# Patient Record
Sex: Female | Born: 1995 | Race: White | Hispanic: No | Marital: Single | Smoking: Never smoker
Health system: Southern US, Community
[De-identification: ages and names within clinical notes are randomized; demographics above are authoritative.]

## PROBLEM LIST (undated history)

## (undated) DIAGNOSIS — R569 Unspecified convulsions: Secondary | ICD-10-CM

## (undated) DIAGNOSIS — F5082 Avoidant/restrictive food intake disorder: Secondary | ICD-10-CM

## (undated) DIAGNOSIS — Z9889 Other specified postprocedural states: Secondary | ICD-10-CM

## (undated) HISTORY — DX: Other specified postprocedural states: Z98.890

## (undated) HISTORY — DX: Avoidant/restrictive food intake disorder: F50.82

## (undated) HISTORY — PX: TYMPANOSTOMY TUBE PLACEMENT: SHX32

---
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
## 2015-09-08 ENCOUNTER — Emergency Department (HOSPITAL_COMMUNITY)
Admission: EM | Admit: 2015-09-08 | Discharge: 2015-09-08 | Disposition: A | Discharge status: Home / Self Care | Payer: Medicaid Other | Attending: Emergency Medicine | Admitting: Emergency Medicine

## 2015-09-08 ENCOUNTER — Emergency Department (HOSPITAL_COMMUNITY): Payer: Medicaid Other

## 2015-09-08 ENCOUNTER — Encounter (HOSPITAL_COMMUNITY): Payer: Self-pay

## 2015-09-08 DIAGNOSIS — Z79899 Other long term (current) drug therapy: Secondary | ICD-10-CM | POA: Insufficient documentation

## 2015-09-08 DIAGNOSIS — Y92000 Kitchen of unspecified non-institutional (private) residence as  the place of occurrence of the external cause: Secondary | ICD-10-CM | POA: Diagnosis not present

## 2015-09-08 DIAGNOSIS — G8929 Other chronic pain: Secondary | ICD-10-CM | POA: Diagnosis not present

## 2015-09-08 DIAGNOSIS — S01511A Laceration without foreign body of lip, initial encounter: Secondary | ICD-10-CM | POA: Insufficient documentation

## 2015-09-08 DIAGNOSIS — Y9389 Activity, other specified: Secondary | ICD-10-CM | POA: Diagnosis not present

## 2015-09-08 DIAGNOSIS — R569 Unspecified convulsions: Secondary | ICD-10-CM

## 2015-09-08 DIAGNOSIS — R1013 Epigastric pain: Secondary | ICD-10-CM | POA: Insufficient documentation

## 2015-09-08 DIAGNOSIS — W1839XA Other fall on same level, initial encounter: Secondary | ICD-10-CM | POA: Insufficient documentation

## 2015-09-08 DIAGNOSIS — Y998 Other external cause status: Secondary | ICD-10-CM | POA: Insufficient documentation

## 2015-09-08 DIAGNOSIS — R112 Nausea with vomiting, unspecified: Secondary | ICD-10-CM | POA: Insufficient documentation

## 2015-09-08 LAB — CBC WITH DIFFERENTIAL/PLATELET
Basophils Absolute: 0 10*3/uL (ref 0.0–0.1)
Basophils Relative: 0 %
EOS ABS: 0.1 10*3/uL (ref 0.0–0.7)
Eosinophils Relative: 2 %
HEMATOCRIT: 40.4 % (ref 36.0–46.0)
HEMOGLOBIN: 13.4 g/dL (ref 12.0–15.0)
LYMPHS ABS: 1.3 10*3/uL (ref 0.7–4.0)
Lymphocytes Relative: 28 %
MCH: 30.2 pg (ref 26.0–34.0)
MCHC: 33.2 g/dL (ref 30.0–36.0)
MCV: 91 fL (ref 78.0–100.0)
Monocytes Absolute: 0.3 10*3/uL (ref 0.1–1.0)
Monocytes Relative: 7 %
NEUTROS ABS: 2.9 10*3/uL (ref 1.7–7.7)
NEUTROS PCT: 63 %
Platelets: 247 10*3/uL (ref 150–400)
RBC: 4.44 MIL/uL (ref 3.87–5.11)
RDW: 14.2 % (ref 11.5–15.5)
WBC: 4.6 10*3/uL (ref 4.0–10.5)

## 2015-09-08 LAB — URINE MICROSCOPIC-ADD ON

## 2015-09-08 LAB — RAPID URINE DRUG SCREEN, HOSP PERFORMED
AMPHETAMINES: NOT DETECTED
BARBITURATES: NOT DETECTED
Benzodiazepines: NOT DETECTED
COCAINE: NOT DETECTED
Opiates: NOT DETECTED
TETRAHYDROCANNABINOL: NOT DETECTED

## 2015-09-08 LAB — COMPREHENSIVE METABOLIC PANEL
ALT: 15 U/L (ref 14–54)
ANION GAP: 11 (ref 5–15)
AST: 19 U/L (ref 15–41)
Albumin: 3.8 g/dL (ref 3.5–5.0)
Alkaline Phosphatase: 42 U/L (ref 38–126)
BUN: 8 mg/dL (ref 6–20)
CO2: 23 mmol/L (ref 22–32)
CREATININE: 0.81 mg/dL (ref 0.44–1.00)
Calcium: 9 mg/dL (ref 8.9–10.3)
Chloride: 106 mmol/L (ref 101–111)
Glucose, Bld: 101 mg/dL — ABNORMAL HIGH (ref 65–99)
Potassium: 4.1 mmol/L (ref 3.5–5.1)
Sodium: 140 mmol/L (ref 135–145)
Total Bilirubin: 0.2 mg/dL — ABNORMAL LOW (ref 0.3–1.2)
Total Protein: 6.9 g/dL (ref 6.5–8.1)

## 2015-09-08 LAB — URINALYSIS, ROUTINE W REFLEX MICROSCOPIC
Bilirubin Urine: NEGATIVE
GLUCOSE, UA: NEGATIVE mg/dL
Ketones, ur: NEGATIVE mg/dL
Leukocytes, UA: NEGATIVE
Nitrite: NEGATIVE
PH: 7.5 (ref 5.0–8.0)
Protein, ur: NEGATIVE mg/dL
SPECIFIC GRAVITY, URINE: 1.01 (ref 1.005–1.030)

## 2015-09-08 LAB — LIPASE, BLOOD: LIPASE: 85 U/L — AB (ref 11–51)

## 2015-09-08 LAB — POC URINE PREG, ED: Preg Test, Ur: NEGATIVE

## 2015-09-08 LAB — ETHANOL: Alcohol, Ethyl (B): <5 mg/dL (ref <5)

## 2015-09-08 MED ORDER — ONDANSETRON HCL 4 MG/2ML IJ SOLN
4.0000 mg | Freq: Once | INTRAMUSCULAR | Status: AC
  Administered 2015-09-08: 4 mg via INTRAVENOUS
  Filled 2015-09-08: qty 2

## 2015-09-08 MED ORDER — LEVETIRACETAM 100 MG/ML PO SOLN
500.0000 mg | ORAL | Status: AC
  Administered 2015-09-08: 500 mg via ORAL
  Filled 2015-09-08: qty 5

## 2015-09-08 MED ORDER — SODIUM CHLORIDE 0.9 % IV BOLUS (SEPSIS)
1000.0000 mL | Freq: Once | INTRAVENOUS | Status: AC
  Administered 2015-09-08: 1000 mL via INTRAVENOUS

## 2015-09-08 MED ORDER — LEVETIRACETAM 500 MG PO TABS
500.0000 mg | ORAL_TABLET | Freq: Once | ORAL | Status: DC
  Filled 2015-09-08 (×2): qty 1

## 2015-09-08 MED ORDER — LEVETIRACETAM 100 MG/ML PO SOLN: 500.0000 mg | Freq: Two times a day (BID) | ORAL | Status: DC

## 2015-09-08 MED ORDER — ONDANSETRON 4 MG PO TBDP: 4.0000 mg | ORAL_TABLET | Freq: Three times a day (TID) | ORAL | Status: DC | PRN

## 2015-09-08 NOTE — Discharge Instructions (Signed)
You have been seen today for a seizure. Your imaging showed no abnormalities. Follow up with neurology in the office as soon as possible for reevaluation and chronic management. Call the office today to make an appointment for sometime in the future. Follow up with PCP as needed. Return to ED should symptoms worsen. You've been started on Keppra 500 mg twice a day, beginning immediately. You should take this medication with food to avoid upset stomach. You must take this medication as directed and consistently for it to have the desired effect. On a side note, your lipase was 85, which is higher than the normal range, but is not an emergent condition, such as acute pancreatitis. This value will just have to be followed up on by your PCP.

## 2015-09-08 NOTE — ED Notes (Signed)
Pt.s mother has concerned about strength of medication.  Reported to Harolyn Rutherford, [place] ,  He is speaking with the family

## 2015-09-08 NOTE — ED Notes (Signed)
Called CT scan pt. Is ready

## 2015-09-08 NOTE — ED Notes (Signed)
Pt. And family is aware of needing an urine specimen.  

## 2015-09-08 NOTE — ED Provider Notes (Signed)
CSN: [id number]     Arrival date & time 09/08/15  4098 History   First MD Initiated Contact with Patient 09/08/15 [phone]     Chief Complaint  Patient presents with  . Seizures     (Consider location/radiation/quality/duration/timing/severity/associated sxs/prior Treatment) HPI   Martha Wells is a 20 y.o. female, with a history of developmental delay and seizures, presenting to the ED with a grand mal seizure that occurred just PTA. Witnessed by patient's father, now at the bedside. Father states that the patient was sitting on a chair in the kitchen, fell to the floor, and began having full body convulsions. Positive for urinary incontinence and vomiting. Patient also complains of abdominal pain, which she indicates is chronic. Patient states that she hurts "a little bit." Pain is nonradiating and is accompanied by nausea. Pt has a history of seizures, but has not had one for over a year, and is not on medication for them. Pt and patient's parents confirm that the patient has never been on seizure medication before. 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 Put on OCPs and increased diet, period returned. EMS reports pt was CAOx4 upon their arrival with no seizure activity noted. Patient's parents at the bedside confirm pt is behaving normally. Pt is currently menstruating. Patient's parents also state that the patient has had intermittent "episodes" that include the patient's face unilaterally becoming numb, followed by a period of 30 seconds to 1 minute where the patient stares straight ahead and is not arousable.   Past Medical History  Diagnosis Date  . Seizures (HCC)     7 weeks old   Past Surgical History  Procedure Laterality Date  . Foot surgery     No family history on file. Social History  Substance Use Topics  . Smoking status: Never  Smoker   . Smokeless tobacco: None  . Alcohol Use: No   OB History    No data available     Review of Systems  Constitutional: Negative for fever, chills and diaphoresis.  Respiratory: Negative for shortness of breath.   Cardiovascular: Negative for chest pain.  Gastrointestinal: Positive for nausea, vomiting and abdominal pain. Negative for diarrhea, constipation and blood in stool.  Genitourinary: Negative for dysuria and hematuria.  Musculoskeletal: Negative for back pain and neck pain.  Skin: Negative for color change and pallor.  Neurological: Positive for seizures. Negative for dizziness, weakness, light-headedness and headaches.  All other systems reviewed and are negative.     Allergies  Review of patient's allergies indicates no known allergies.  Home Medications   Prior to Admission medications   Medication Sig Start Date End Date Taking? Authorizing Provider  Norgestim-Eth Estrad Triphasic (TRI-SPRINTEC PO) Take 1 tablet by mouth daily with supper.   Yes Historical Provider, MD  Aspirin-Acetaminophen-Caffeine (EXCEDRIN PO) Take 2 tablets by mouth daily as needed (pain). Reported on 09/08/2015    Historical Provider, MD  B Complex Vitamins (VITAMIN B COMPLEX) TABS Take 1 tablet by mouth daily. Reported on 09/08/2015    Historical Provider, MD  Calcium Carbonate-Vitamin D (CALCIUM CREAMIES PO) Take 1 tablet by mouth daily. Reported on 09/08/2015    Historical Provider, MD  Cholecalciferol (EQL VITAMIN D GUMMIES CHILD) 400 UNITS CHEW Chew 400 Units by mouth daily. Reported on 09/08/2015    Historical Provider, MD  levETIRAcetam (KEPPRA)  100 MG/ML solution Take 5 mLs (500 mg total) by mouth 2 (two) times daily. 09/08/15   Barrett Goldie C Keshaun Dubey, PA-C  Multiple Vitamin (MULTIVITAMIN WITH MINERALS) TABS tablet Take 1 tablet by mouth daily. Reported on 09/08/2015    Historical Provider, MD  Multiple Vitamins-Minerals (HAIR/SKIN/NAILS/BIOTIN) TABS Take 1 tablet by mouth daily. Reported on 09/08/2015     Historical Provider, MD  ondansetron (ZOFRAN ODT) 4 MG disintegrating tablet Take 1 tablet (4 mg total) by mouth every 8 (eight) hours as needed for nausea or vomiting. 09/08/15   Velva Molinari C Milayna Rotenberg, PA-C  vitamin E 400 UNIT capsule Take 400 Units by mouth daily. Reported on 09/08/2015    Historical Provider, MD   BP 86/69 mmHg  Pulse 90  Temp(Src) 98.9 F (37.2 C) (Oral)  Resp 22  SpO2 100%  LMP 09/08/2015 Physical Exam  Constitutional: She is oriented to person, place, and time. She appears well-developed and well-nourished. No distress.  HENT:  Head: Normocephalic and atraumatic.  Mouth/Throat: Oropharynx is clear and moist.  0.25 cm superficial laceration noted to inside upper lip at the midline. Associated swelling noted. Dentition intact and accounted for.  Eyes: Conjunctivae and EOM are normal. Pupils are equal, round, and reactive to light.  Pupils contrict readily to light, but then expand and contract slightly once contricted.   Neck: Normal range of motion. Neck supple.  Cardiovascular: Normal rate, regular rhythm, normal heart sounds and intact distal pulses.   Pulmonary/Chest: Effort normal and breath sounds normal. No respiratory distress.  Abdominal: Soft. Bowel sounds are normal. There is tenderness in the epigastric area. There is no guarding.  Musculoskeletal: She exhibits no edema or tenderness.  Full ROM in all extremities and spine. No paraspinal tenderness.   Lymphadenopathy:    She has no cervical adenopathy.  Neurological: She is alert and oriented to person, place, and time. She has normal reflexes.  No sensory deficits. Strength 5/5 in all extremities. No gait disturbance. Coordination intact. Cranial nerves III-XII grossly intact. No facial droop.   Skin: Skin is warm and dry. She is not diaphoretic.  Psychiatric: She has a normal mood and affect. Her behavior is normal.  Nursing note and vitals reviewed.   ED Course  Procedures (including critical care time) Labs  Review Labs Reviewed  COMPREHENSIVE METABOLIC PANEL - Abnormal; Notable for the following:    Glucose, Bld 101 (*)    Total Bilirubin 0.2 (*)    All other components within normal limits  URINALYSIS, ROUTINE W REFLEX MICROSCOPIC (NOT AT [facility]) - Abnormal; Notable for the following:    APPearance TURBID (*)    Hgb urine dipstick LARGE (*)    All other components within normal limits  LIPASE, BLOOD - Abnormal; Notable for the following:    Lipase 85 (*)    All other components within normal limits  URINE MICROSCOPIC-ADD ON - Abnormal; Notable for the following:    Squamous Epithelial / LPF 0-5 (*)    Bacteria, UA RARE (*)    All other components within normal limits  URINE CULTURE  CBC WITH DIFFERENTIAL/PLATELET  URINE RAPID DRUG SCREEN, HOSP PERFORMED  ETHANOL  POC URINE PREG, ED  CBG MONITORING, ED    Imaging Review Ct Head Wo Contrast  09/08/2015  CLINICAL DATA:  Patient found down this morning in grand mal seizure. Upper lip swelling and swelling about the right eye. Initial encounter. EXAM: CT HEAD WITHOUT CONTRAST CT MAXILLOFACIAL WITHOUT CONTRAST CT CERVICAL SPINE WITHOUT CONTRAST TECHNIQUE: Multidetector CT  imaging of the head, cervical spine, and maxillofacial structures were performed using the standard protocol without intravenous contrast. Multiplanar CT image reconstructions of the cervical spine and maxillofacial structures were also generated. COMPARISON:  Brain MRI 09/24/2013. FINDINGS: CT HEAD FINDINGS Remote bilateral parietal lobe infarcts, worse on the left, are seen. There are also remote bilateral occipital lobe infarcts, worse on the right. No evidence of acute intracranial abnormality including hemorrhage, infarct, mass lesion, mass effect, midline shift or abnormal extra-axial fluid collection is identified. There is no hydrocephalus or pneumocephalus. Imaged paranasal sinuses and mastoid air cells are clear. The calvarium is intact. CT MAXILLOFACIAL FINDINGS No facial  bone fracture is identified. The globes are intact and the lenses are located. The paranasal sinuses and mastoid air cells are clear. CT CERVICAL SPINE FINDINGS Vertebral body height and alignment are maintained in the cervical spine. 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 Lung apices are clear. IMPRESSION: No acute abnormality head, face or cervical spine. Remote bilateral parietal and occipital lobe infarcts. Electronically Signed   By: Drusilla Kanner M.D.   On: 09/08/2015 10:14   Ct Cervical Spine Wo Contrast  09/08/2015  CLINICAL DATA:  Patient found down this morning in grand mal seizure. Upper lip swelling and swelling about the right eye. Initial encounter. EXAM: CT HEAD WITHOUT CONTRAST CT MAXILLOFACIAL WITHOUT CONTRAST CT CERVICAL SPINE WITHOUT CONTRAST TECHNIQUE: Multidetector CT imaging of the head, cervical spine, and maxillofacial structures were performed using the standard protocol without intravenous contrast. Multiplanar CT image reconstructions of the cervical spine and maxillofacial structures were also generated. COMPARISON:  Brain MRI 09/24/2013. FINDINGS: CT HEAD FINDINGS Remote bilateral parietal lobe infarcts, worse on the left, are seen. There are also remote bilateral occipital lobe infarcts, worse on the right. No evidence of acute intracranial abnormality including hemorrhage, infarct, mass lesion, mass effect, midline shift or abnormal extra-axial fluid collection is identified. There is no hydrocephalus or pneumocephalus. Imaged paranasal sinuses and mastoid air cells are clear. The calvarium is intact. CT MAXILLOFACIAL FINDINGS No facial bone fracture is identified. The globes are intact and the lenses are located. The paranasal sinuses and mastoid air cells are clear. CT CERVICAL SPINE FINDINGS Vertebral body height and alignment are maintained in the cervical spine. 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 Lung apices are clear. IMPRESSION: No acute abnormality head, face or cervical spine. Remote bilateral parietal and occipital lobe infarcts. Electronically Signed   By: Drusilla Kanner M.D.   On: 09/08/2015 10:14   Ct Maxillofacial Wo Cm  09/08/2015  CLINICAL DATA:  Patient found down this morning in grand mal seizure. Upper lip swelling and swelling about the right eye. Initial encounter. EXAM: CT HEAD WITHOUT CONTRAST CT MAXILLOFACIAL WITHOUT CONTRAST CT CERVICAL SPINE WITHOUT CONTRAST TECHNIQUE: Multidetector CT imaging of the head, cervical spine, and maxillofacial structures were performed using the standard protocol without intravenous contrast. Multiplanar CT image reconstructions of the cervical spine and maxillofacial structures were also generated. COMPARISON:  Brain MRI 09/24/2013. FINDINGS: CT HEAD FINDINGS Remote bilateral parietal lobe infarcts, worse on the left, are seen. There are also remote bilateral occipital lobe infarcts, worse on the right. No evidence of acute intracranial abnormality including hemorrhage, infarct, mass lesion, mass effect, midline shift or abnormal extra-axial fluid collection is identified. There is no hydrocephalus or pneumocephalus. Imaged paranasal sinuses and mastoid air cells are clear. The calvarium is intact. CT MAXILLOFACIAL FINDINGS No  facial bone fracture is identified. The globes are intact and the lenses are located. The paranasal sinuses and mastoid air cells are clear. CT CERVICAL SPINE FINDINGS Vertebral body height and alignment are maintained in the cervical spine. 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 Lung apices are clear. IMPRESSION: No acute abnormality head, face or cervical spine. Remote bilateral parietal and occipital lobe infarcts. Electronically Signed   By: Drusilla Kanner M.D.   On: 09/08/2015 10:14   I have personally reviewed and evaluated these images and lab results as part of my  medical decision-making.   EKG Interpretation   Date/Time:  Friday September 08 2015 08:47:13 EST Ventricular Rate:  88 PR Interval:  156 QRS Duration: 81 QT Interval:  368 QTC Calculation: 445 R Axis:   90 Text Interpretation:  Sinus rhythm No significant change since last  tracing Confirmed by Denton Lank  MD, Caryn Bee ([id number]) on 09/08/2015 8:52:39 AM      MDM   Final diagnoses:  Seizure (HCC)    Martha Wells presents following a seizure and fall that occurred just prior to arrival.  Findings and plan of care discussed with Cathren Laine, MD.  Patient receive labs and imaging a consistent with a full seizure assessment. Neurology consult. Patient is afebrile, nontoxic-appearing, not tachycardic, not tachypneic upon my exam, is alert and oriented, maintains SPO2 of 99-100% on room air, is normotensive, and is in no apparent distress. Patient has not had repeat seizures since the original one today.  9:42 AM Spoke with Dr. Amada Jupiter, neurologist, who advised that the patient should be started on Keppra  BID and have her follow-up in the office outpatient. Patient and patient's parents were updated on this treatment plan. All parties agreed to the plan. Patient's parents had many follow-up questions about the choice of medication as well as the side effects. All questions and concerns were addressed. Patient and patient's parents were given informational printouts on Keppra and encouraged to bring any further questions or concerns either to myself or to the neurologist after discharge. Patient and her family were updated on all lab results and advised to follow-up on the lipase with her PCP. In regards to the patient's blood pressure, she readily ambulates without trouble, has no complaints of dizziness or lightheadedness, and confirms that these readings and the variation are typical for her.  Filed Vitals:   09/08/15 0852 09/08/15 0900 09/08/15 1000 09/08/15 1015  BP: 107/69  107/71 104/76 102/70  Pulse: 97 98 83 90  Temp: 98.1 F (36.7 C)     TempSrc: Oral     Resp: SpO2: 100% 99% 100% 99%   Filed Vitals:   09/08/15 1207 09/08/15 1230 09/08/15 1330 09/08/15 1351  BP: 98/67 98/71  86/69  Pulse: 87 72 90   Temp:      TempSrc:      Resp: SpO2: 100% 100% 100%       Anselm Pancoast, PA-C 09/08/15 1404  Cathren Laine, MD 09/08/15 1414

## 2015-09-08 NOTE — ED Notes (Signed)
Pt ambulated to restroom with assist

## 2015-09-08 NOTE — ED Notes (Signed)
[employer] arrived

## 2015-09-08 NOTE — ED Notes (Signed)
Pt. Is also aware of needing an urine specimen 

## 2015-09-08 NOTE — ED Notes (Signed)
Pt. Woke up this am to get ready for work and family member found her on the floor in a gran mal seizure.  She also had vomited at one point.  There was cottage cheese on the floor beside her.  Paramedics reports that she was alert and oriented X4 when they arrived.  Her rt. Upper lip is swollen and swelling noted under her rt. Eye.  She has a hx of seizures when she was an infant.  She has never been on medication for them.   Family at the bedside.    This seizure was unwitnessed. Pt. Denies any pain at this time

## 2015-09-08 NOTE — ED Notes (Addendum)
Pt. Eating a [place] sandwich, jello and apple juice. Encouraging pt. To eat , prior to giving her Keppra.

## 2015-09-08 NOTE — ED Notes (Signed)
Reported pt.s Blood pressure 86/69  Shawn, PA>  Pt. Is not having any dizziness or lightheaded .  She is walking in the room, waiting for her mom to bring clothes.  Pt. s fathers stated, "This is her normal"

## 2015-09-08 NOTE — ED Notes (Signed)
Martha Wells tried to do the CBg, she was unsuccessful due to not being able to get a full blood sample.

## 2015-09-08 NOTE — ED Notes (Signed)
Pt. Unable to swallow pill, spoke with Fleet Contras , pharmacist. She will be ordering Keppra in suspension form.

## 2015-09-08 NOTE — ED Notes (Signed)
No seizure noted since arrival

## 2015-09-08 NOTE — ED Notes (Signed)
Pt. Has not ate any of the food,. Gave pt. A menu to order something from there.

## 2015-09-10 LAB — URINE CULTURE

## 2015-09-12 ENCOUNTER — Ambulatory Visit (INDEPENDENT_AMBULATORY_CARE_PROVIDER_SITE_OTHER): Payer: Medicaid Other | Admitting: Neurology

## 2015-09-12 ENCOUNTER — Encounter: Payer: Self-pay | Admitting: Neurology

## 2015-09-12 VITALS — BP 88/60 | HR 79 | Ht 62.0 in | Wt 97.8 lb

## 2015-09-12 DIAGNOSIS — I639 Cerebral infarction, unspecified: Secondary | ICD-10-CM

## 2015-09-12 DIAGNOSIS — R6250 Unspecified lack of expected normal physiological development in childhood: Secondary | ICD-10-CM | POA: Diagnosis not present

## 2015-09-12 DIAGNOSIS — R569 Unspecified convulsions: Secondary | ICD-10-CM

## 2015-09-12 NOTE — Patient Instructions (Signed)
Remember to drink plenty of fluid, eat healthy meals and do not skip any meals. Try to eat protein with a every meal and eat a healthy snack such as fruit or nuts in between meals. Try to keep a regular sleep-wake schedule and try to exercise daily, particularly in the form of walking, 20-30 minutes a day, if you can.   As far as your medications are concerned, I would like to suggest: continue Keppra  As far as diagnostic testing: EEG  I would like to see you back in 3 months, sooner if we need to. Please call us with any interim questions, concerns, problems, updates or refill requests.   Our phone number is [phone]. We also have an after hours call service for urgent matters and there is a physician on-call for urgent questions. For any emergencies you know to call 911 or go to the nearest emergency room

## 2015-09-12 NOTE — Progress Notes (Signed)
[facility]    Provider:  Dr Lucia Gaskins Referring Provider: Barbie Banner, MD Primary Care Physician:  Pamelia Hoit, MD  CC:  Seizure  HPI:  Martha Wells is a 20 y.o. female here as a referral from Dr. Andrey Campanile for seizures. Has a history of developmental delay and seizures, she was not on medication when see in the ED 09/08/2015 for gtcs. Never been on AEDs. Parents here and provide most information. She has anorexia. BP in the Ed was 86/69. She also has reported staring spells. She had her first seizure as an infant at 6 weeks. Then not again until 2 years ago when she had an episode of numbness on the left side and inability to speak.  She had another one a year ago and then recently. Last year she passed out eating lunch, they were told she had a seizure and her blood glucose was 40 and described as shaking on the floor. Teacher said this was def a seizure in March or April. 2 years ago it happened at the table c/o numbness on the left side face and arm, speech become impaired then it passes but she is aware and responsive and interactive and these happen sporadically once in a month.  Last Friday she was getting ready for work, dad heard a fall and vocalizations, she was twitching and on her side and had vomited, she was not responsive, no tongue biting, she was breathing, she was trying to speak but couldn't and trying to get up. Eyes were closed and fluttering a little, she heard his voice. She was sitting up in a few minutes and disoriented.She remembers eating and she noticed numbness in the left hand and left face and remembers falling and hitting the floor but she is not sure the next thing she remembers is the paramedics around her. They started her on Keppra and sh eis a litle sleepy. She graduated high school. She is taking some online classes. She is seeing a therapist and has been told these episodes are due to stress.   Reviewed notes, labs and imaging from  outside physicians, which showed:  Patient seen in the ED 09/08/2015. Per notes presenting to the ED with a grand mal seizure that occurred just PTA. Witnessed by patient's father, now at the bedside. Father states that the patient was sitting on a chair in the kitchen, fell to the floor, and began having full body convulsions. Positive for urinary incontinence and vomiting. Patient also complains of abdominal pain, which she indicates is chronic. Patient states that she hurts "a little bit." Pain is nonradiating and is accompanied by nausea. Pt has a history of seizures, but has not had one for over a year, and is not on medication for them. Pt and patient's parents confirm that the patient has never been on seizure medication before. 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 Put on OCPs and increased diet, period returned. EMS reports pt was CAOx4 upon their arrival with no seizure activity noted. Patient's parents at the bedside confirm pt is behaving normally. Pt is currently menstruating. Patient's parents also state that the patient has had intermittent "episodes" that include the patient's face unilaterally becoming numb, followed by a period of 30 seconds to 1 minute where the patient stares straight ahead and is not arousable.Her BP was 86/69  but she was asymptomatic.   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 Smaller areas of chronic infarction in the occipital lobes bilaterally. No acute abnormality.  IMPRESSION: No acute abnormality head, face or cervical spine. Remote bilateral parietal and occipital lobe infarcts.  Seen in the ED 2/3. Urine rapid drug screen negative, pregnancy test negative, CMP and CBC normal, EtOH  negative.   Her emergency room notes on 09/08/2015, patient presented to the ED with reports of generalized tonic-clonic seizure witnessed by patient's father. Father states that the patient was sitting on a chair in the kitchen and fell to the floor and began having full body convulsions. Positive for urinary incontinence and vomiting. EMS reported patient was alert and oriented 4 upon their arrival with no seizure activity noted. Patient was at baseline in the emergency room. He spoke with Dr. Amada Jupiter who started the patient on 500 mg twice a day of Keppra with outpatient follow-up in neurology.    Review of Systems: Patient complains of symptoms per HPI as well as the following symptoms: Numbness. No chest pain or shortness of breath.. Pertinent negatives per HPI. All others negative.   Social History   Social History  . Marital Status: Single    Spouse Name: N/A  . Number of Children: 0  . Years of Education: 12   Occupational History  . [employer]    Social History Main Topics  . Smoking status: Never Smoker   . Smokeless tobacco: Not on file  . Alcohol Use: No  . Drug Use: No  . Sexual Activity: Not on file   Other Topics Concern  . Not on file   Social History Narrative   Lives with parents   Caffeine use: daily iced tea    Family History  Problem Relation Age of Onset  . Diabetes Mother   . Breast cancer Maternal Grandmother   . Breast cancer Paternal Grandmother   . Heart disease Maternal Grandfather   . Seizures Neg Hx     Past Medical History  Diagnosis Date  . Seizures (HCC)     45 weeks old  . Hx of foot surgery 2008    reconstruction- right     Past Surgical History  Procedure Laterality Date  . Foot surgery Right 2008    reconstruction    Current Outpatient Prescriptions  Medication Sig Dispense Refill  . levETIRAcetam (KEPPRA) 100 MG/ML solution Take 5 mLs (500 mg total) by mouth 2 (two) times daily. 473 mL 12  . Norgestim-Eth Estrad  Triphasic (TRI-SPRINTEC PO) Take 1 tablet by mouth daily with supper.     No current facility-administered medications for this visit.    Allergies as of 09/12/2015  . (No Known Allergies)    Vitals: BP 88/60 mmHg  Pulse 79  Ht 5\' 2"  (1.575 m)  Wt 97 lb 12.8 oz (44.362 kg)  BMI 17.88 kg/m2  LMP 09/08/2015 Last Weight:  Wt Readings from Last 1 Encounters:  09/12/15 97 lb 12.8 oz (44.362 kg) (2 %*, Z = -2.02)   * Growth percentiles are based on CDC 2-20 Years data.   Last Height:   Ht Readings from Last 1 Encounters:  09/12/15 5\' 2"  (1.575 m) (18 %*, Z = -0.90)   * Growth percentiles are based on CDC 2-20 Years data.   Physical exam: Exam: Gen: NAD, conversant, well nourised, thin, well groomed  CV: RRR, no MRG. No Carotid Bruits. No peripheral edema, warm, nontender Eyes: Conjunctivae clear without exudates or hemorrhage  Neuro: Detailed Neurologic Exam  Speech:    Speech is normal; fluent and spontaneous with normal comprehension.  Cognition:    The patient is oriented to person, place, and time;     recent and remote memory intact;     language fluent;     normal attention, concentration,     fund of knowledge impaired  Cranial Nerves:    The pupils are equal, round, and reactive to light. The fundi are normal and spontaneous venous pulsations are present. Visual fields are full to finger confrontation. Extraocular movements are intact. Trigeminal sensation is intact and the muscles of mastication are normal. The face is symmetric. The palate elevates in the midline. Hearing intact. Voice is normal. Shoulder shrug is normal. The tongue has normal motion without fasciculations.   Coordination:    Normal finger to nose and heel to shin. Normal rapid alternating movements.   Gait:    Difficulty with Heel-toe, imbalance with tandem.   Motor Observation:    No asymmetry, no atrophy, and no involuntary movements noted. Tone:    Normal muscle  tone.    Posture:    Posture is normal. normal erect    Strength:    Strength is V/V in the upper and lower limbs.      Sensation: intact to LT     Reflex Exam:  DTR's:    Deep tendon reflexes in the upper and lower extremities are normal bilaterally.   Toes:    The toes are downgoing bilaterally.   Clonus:    Clonus is absent.       Assessment/Plan:   20 y.o. female here as a referral from Dr. Andrey Campanile for seizures. 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 MRI brain chronic hemorrhagic infarction in the parietal lobe bilaterally left greater than right and maller areas of chronic infarction in the occipital lobes bilaterally. Recently started on Keppra for GTCS.   Continue keppra Routine EEG Consider 3-day eeg pending results routine eeg Patient is unable to drive, operate heavy machinery, perform activities at heights or participate in water activities until 6 months seizure free Call EMS with any other seizure-like activity  Cc: fred wilson  Naomie Dean, MD  [facility] [address] [place], [place] [address]  Phone [phone] Fax [phone]

## 2015-09-17 ENCOUNTER — Encounter: Payer: Self-pay | Admitting: Neurology

## 2015-09-17 DIAGNOSIS — R6250 Unspecified lack of expected normal physiological development in childhood: Secondary | ICD-10-CM | POA: Insufficient documentation

## 2015-09-17 DIAGNOSIS — I639 Cerebral infarction, unspecified: Secondary | ICD-10-CM | POA: Insufficient documentation

## 2015-09-17 DIAGNOSIS — R569 Unspecified convulsions: Secondary | ICD-10-CM | POA: Insufficient documentation

## 2015-09-27 ENCOUNTER — Ambulatory Visit (INDEPENDENT_AMBULATORY_CARE_PROVIDER_SITE_OTHER): Payer: Medicaid Other | Admitting: Neurology

## 2015-09-27 DIAGNOSIS — R569 Unspecified convulsions: Secondary | ICD-10-CM

## 2015-09-27 NOTE — Procedures (Signed)
    History:   Martha Wells is a 20 year old patient with developmental delay and seizures. The patient was seen recently on 09/08/2015 in the emergency room for generalized seizures. The patient had a seizure at age 46 weeks, and again 2 years ago. The patient has had staring episodes and episodes of left-sided numbness and inability to speak. The patient being evaluated for the seizures.  This is a routine EEG. No skull defects are noted. Medications include Keppra and birth control pills.   EEG classification: Normal awake  Description of the recording: The background rhythms of this recording consists of a fairly well modulated medium amplitude alpha rhythm of 9 Hz that is reactive to eye opening and closure. As the record progresses, the patient appears to remain in the waking state throughout the recording. Early in the recording, there appears to be excessive muscle and head movement artifact securing the background rhythm activities. Towards the end of the recording, the patient appears to be more calm. Photic stimulation was performed, resulting in a bilateral and symmetric photic driving response. Hyperventilation was also performed, resulting in a minimal buildup of the background rhythm activities without significant slowing seen. At no time during the recording does there appear to be evidence of spike or spike wave discharges or evidence of focal slowing. EKG monitor shows no evidence of cardiac rhythm abnormalities with a heart rate of 84.  Impression: This is a normal EEG recording in the waking state. This is a technically difficult study early on secondary to excessive head movement and muscle artifact. No evidence of ictal or interictal discharges are seen.

## 2015-09-28 ENCOUNTER — Telehealth: Payer: Self-pay | Admitting: Neurology

## 2015-09-28 NOTE — Telephone Encounter (Signed)
Patient's mother is calling to get EEG results for the patient.

## 2015-09-28 NOTE — Telephone Encounter (Signed)
I called the mother, one over the EEG results. The study was essentially normal. There was a lot of head movement and muscle artifact in the beginning of the recording.

## 2015-09-29 ENCOUNTER — Telehealth: Payer: Self-pay | Admitting: *Deleted

## 2015-09-29 NOTE — Telephone Encounter (Signed)
Called and spoke to mother about EEG results. Advised it was normal. They would like to proceed with 3-day in home EEG. She stated Dr Anne Hahn did test and pt had numbness yesterday in left hand/leg and face. She did not tell anyone. Usually happens when she eats food. It has resolved since. She would also like to possibly switch her seizure medication. The Keppra is causing extreme mood changes. I advised Dr Lucia Gaskins out of the office until Monday. I will give her the message and we will call back to advise. She verbalized understanding.

## 2015-09-29 NOTE — Telephone Encounter (Signed)
-----   Message from [name], MD sent at 09/28/2015  7:32 PM EST ----- EEG was normal. Please ask them if they are interested in the 3-day ambulatory (home) video EEG we discussed. I would like to get an episode while she is on EEG and recommend the study. Please have the form ready for me to sign Monday  if they would like to have the eeg. thanks

## 2015-10-02 NOTE — Telephone Encounter (Signed)
Faxed order for 72-hr EEG to be scheduled ASAP to [facility]. Received confirmation. Fax: [phone].

## 2015-10-02 NOTE — Telephone Encounter (Signed)
Pt's mother called and says that pts numbness has increased on her left side. Mother said these are precursors to pt having grand mal seizures. Please call and advise [phone], Darl Pikes

## 2015-10-02 NOTE — Telephone Encounter (Signed)
Mood changes on the Keppra. Will get the ambulatory EEG asap. If needed, start Lamictal. Can stop the morning dose of Keppra for 3 days then can stop.

## 2015-10-02 NOTE — Telephone Encounter (Signed)
Received referral status notification that pt scheduled 10/04/15-10/07/15 for 72-hr AMB EEG.

## 2015-10-05 DIAGNOSIS — Z0279 Encounter for issue of other medical certificate: Secondary | ICD-10-CM

## 2015-10-11 NOTE — Telephone Encounter (Signed)
Received notice from [facility] that EEG was completed and they will notify our office within 48hr to let us know when report is scanned and ready for review.

## 2015-10-16 NOTE — Telephone Encounter (Signed)
Received results from 72hr EEG from [facility]. Results given to Dr Lucia GaskinsAhern for review.

## 2015-10-24 NOTE — Telephone Encounter (Addendum)
Dr Lucia GaskinsAhern- please advise Called and spoke to mother. Advised Dr Lucia Gaskinsahern out of office until tomorrow. She stated that is fine to wait until tomorrow or the next day for an answer (or even by end of week). Told her we will call her back to advise. She verbalized understanding and appreciation.

## 2015-10-24 NOTE — Telephone Encounter (Addendum)
Pt's mother called inquiring about results from EEG. She said the levETIRAcetam (KEPPRA) 100 MG/ML solution has been reduced to 1 x day for since 10/02/15. She is wanting to know what is the new medication and should it be started? She is aware Dr Lucia GaskinsAhern will be in office tomorrow

## 2015-10-25 NOTE — Telephone Encounter (Signed)
Martha Wells,  The eeg was abnormal. I would like to start her on Lamictal. I called and left a message. Please call and schedule a follow up in the office to review the EEG and discuss Lamictal. If we can't fit her in this week, then let me know and I will prescribe Lamictal over the phone but I would rather talk to her in person about it and the dosage. thanks

## 2015-10-26 ENCOUNTER — Ambulatory Visit (INDEPENDENT_AMBULATORY_CARE_PROVIDER_SITE_OTHER): Payer: Medicaid Other | Admitting: Neurology

## 2015-10-26 VITALS — BP 93/64 | HR 77 | Ht 62.0 in | Wt 102.8 lb

## 2015-10-26 DIAGNOSIS — IMO0002 Reserved for concepts with insufficient information to code with codable children: Secondary | ICD-10-CM

## 2015-10-26 DIAGNOSIS — G40209 Localization-related (focal) (partial) symptomatic epilepsy and epileptic syndromes with complex partial seizures, not intractable, without status epilepticus: Secondary | ICD-10-CM | POA: Diagnosis not present

## 2015-10-26 DIAGNOSIS — G40309 Generalized idiopathic epilepsy and epileptic syndromes, not intractable, without status epilepticus: Secondary | ICD-10-CM

## 2015-10-26 MED ORDER — LAMOTRIGINE 25 MG PO CHEW: 100.0000 mg | CHEWABLE_TABLET | Freq: Two times a day (BID) | ORAL | Status: DC

## 2015-10-26 NOTE — Patient Instructions (Signed)
-   start Lamotrigine with a goal dose of 100mg  by mouth twice a day Week 1: Take 25mg  (one pill) by mouth at night every day Week 2: Take 25mg  (one pill) by mouth in the morning and night every day Week 3: Take 50mg  (two pills) by mouth every night and 25mg  every morning  Week 4: Take 50mg  (two pills) by mouth every night and every morning  Week 5: Take 75mg  (three pills) every night and take 50 mg (two pills) every morning  Week 6: Take 75mg  (three pills) every night and morning  Week 7: Take 100mg  (four pills) every night and take 75mg  (three pills) every morning  Week 8: Take 100mg  (four pills) every night and morning  Please call with any questions or if you develop a rash call immediately and stop taking the Lamotrigine.

## 2015-10-26 NOTE — Telephone Encounter (Addendum)
Dr Lucia GaskinsAhern- FYI  Called and spoke to mother. Relayed Dr Lucia GaskinsAhern message below. Mother verbalized understanding. Offered appt for tomorrow. 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 Made f/u today at 2:30pm, check in 2:15pm. Mother states father will be bringing her to appt.

## 2015-10-26 NOTE — Progress Notes (Signed)
[facility]    Provider:  Dr Lucia Gaskins Referring Provider: Barbie Banner, MD Primary Care Physician:  Pamelia Hoit, MD  CC: Seizure  Interval history 10/26/2015: This is a lovely patient is here with her dad for follow-up after a 3 day ambulatory video EEG that was abnormal. Patient was started on Keppra at last appointment however she is having side effects including behavior changes. Her long discussion about epilepsy with father and patient. We'll start Lamictal. Lamictal is a great medication for both seizures and is also used for mood disorders so does not cause behavioral side effects like the Keppra. We'll continue Keppra while we titrating up on Lamictal.     PHYSICAN CONCLUSION/IMPRESSION:   This was an abnormal prolonged ambulatory 72-hour video EEG.  Sharp epileptiform discharges seen in the right hemisphere -right fronto-temporal (F8/T4/T6), temporal (T4/T6 regions). These discharges were present while awake but became more frequent during sleep. Also seen during sleep were isolated bursts of sharp (as well as spike and wave), generalized, frontally-predominant discharges. These discharges were fairly frequent. No electrographic or electroclinical events present. There was no focal or background slowing seen.  There were 4 push button events. These did not correlate with any changes on the EEG.  Owing to this prolonged VEEG being abnormal with focal abnormalities (right hemispheric discharges), if not recently obtained, a brain MRI and optimization of the anticonvulsant medication is recommended.  HPI: Martha Wells is a 20 y.o. female here as a referral from Dr. Andrey Campanile for seizures. Has a history of developmental delay and seizures, she was not on medication when see in the ED 09/08/2015 for gtcs. Never been on AEDs. Parents here and provide most information. She has anorexia. BP in the Ed was 86/69. She also has reported staring spells. She had her  first seizure as an infant at 6 weeks. Then not again until 2 years ago when she had an episode of numbness on the left side and inability to speak. She had another one a year ago and then recently. Last year she passed out eating lunch, they were told she had a seizure and her blood glucose was 40 and described as shaking on the floor. Teacher said this was def a seizure in March or April. 2 years ago it happened at the table c/o numbness on the left side face and arm, speech become impaired then it passes but she is aware and responsive and interactive and these happen sporadically once in a month. Last Friday she was getting ready for work, dad heard a fall and vocalizations, she was twitching and on her side and had vomited, she was not responsive, no tongue biting, she was breathing, she was trying to speak but couldn't and trying to get up. Eyes were closed and fluttering a little, she heard his voice. She was sitting up in a few minutes and disoriented.She remembers eating and she noticed numbness in the left hand and left face and remembers falling and hitting the floor but she is not sure the next thing she remembers is the paramedics around her. They started her on Keppra and sh eis a litle sleepy. She graduated high school. She is taking some online classes. She is seeing a therapist and has been told these episodes are due to stress.   Reviewed notes, labs and imaging from outside physicians, which showed:  Patient seen in the ED 09/08/2015. Per notes presenting to the ED with a grand mal seizure that occurred just PTA.  Witnessed by patient's father, now at the bedside. Father states that the patient was sitting on a chair in the kitchen, fell to the floor, and began having full body convulsions. Positive for urinary incontinence and vomiting. Patient also complains of abdominal pain, which she indicates is chronic. Patient states that she hurts "a little bit." Pain is nonradiating and is accompanied  by nausea. Pt has a history of seizures, but has not had one for over a year, and is not on medication for them. Pt and patient's parents confirm that the patient has never been on seizure medication before. 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 Put on OCPs and increased diet, period returned. EMS reports pt was CAOx4 upon their arrival with no seizure activity noted. Patient's parents at the bedside confirm pt is behaving normally. Pt is currently menstruating. Patient's parents also state that the patient has had intermittent "episodes" that include the patient's face unilaterally becoming numb, followed by a period of 30 seconds to 1 minute where the patient stares straight ahead and is not arousable.Her BP was 86/69 but she was asymptomatic.   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 Smaller areas of chronic infarction in the occipital lobes bilaterally. No acute abnormality.  IMPRESSION: No acute abnormality head, face or cervical spine. Remote bilateral parietal and occipital lobe infarcts.  Seen in the ED 2/3. Urine rapid drug screen negative, pregnancy test negative, CMP and CBC normal, EtOH negative.   Her emergency room notes on 09/08/2015, patient presented to the ED with reports of generalized tonic-clonic seizure witnessed by patient's father. Father states that the patient was sitting on a chair in the kitchen and fell to the floor and began having full body convulsions. Positive for urinary incontinence and vomiting. EMS reported patient was alert and oriented 4 upon their arrival with no seizure activity noted. Patient was at baseline in the emergency room. He spoke with Dr. Amada Jupiter who started the patient  on 500 mg twice a day of Keppra with outpatient follow-up in neurology.     Social History   Social History  . Marital Status: Single    Spouse Name: N/A  . Number of Children: 0  . Years of Education: 12   Occupational History  . [employer]    Social History Main Topics  . Smoking status: Never Smoker   . Smokeless tobacco: Not on file  . Alcohol Use: No  . Drug Use: No  . Sexual Activity: Not on file   Other Topics Concern  . Not on file   Social History Narrative   Lives with parents   Caffeine use: daily iced tea    Family History  Problem Relation Age of Onset  . Diabetes Mother   . Breast cancer Maternal Grandmother   . Breast cancer Paternal Grandmother   . Heart disease Maternal Grandfather   . Seizures Neg Hx     Past Medical History  Diagnosis Date  . Seizures (HCC)     48 weeks old  . Hx of foot surgery 2008    reconstruction- right     Past Surgical History  Procedure Laterality Date  . Foot surgery Right 2008    reconstruction    Current Outpatient  Prescriptions  Medication Sig Dispense Refill  . levETIRAcetam (KEPPRA) 100 MG/ML solution Take 5 mLs (500 mg total) by mouth 2 (two) times daily. (Patient taking differently: Take 500 mg by mouth daily. ) 473 mL 12  . Norgestim-Eth Estrad Triphasic (TRI-SPRINTEC PO) Take 1 tablet by mouth daily with supper.     No current facility-administered medications for this visit.    Allergies as of 10/26/2015  . (No Known Allergies)    Vitals: BP 93/64 mmHg  Pulse 77  Ht 5\' 2"  (1.575 m)  Wt 102 lb 12.8 oz (46.63 kg)  BMI 18.80 kg/m2 Last Weight:  Wt Readings from Last 1 Encounters:  10/26/15 102 lb 12.8 oz (46.63 kg)   Last Height:   Ht Readings from Last 1 Encounters:  10/26/15 5\' 2"  (1.575 m)   Neuro: Detailed Neurologic Exam  Speech:  Speech is normal; fluent and spontaneous with normal comprehension.  Cognition:  The patient is oriented to person, place, and time;   recent  and remote memory intact;   language fluent;   normal attention, concentration,   fund of knowledge impaired  Cranial Nerves:  The pupils are equal, round, and reactive to light. The fundi are normal and spontaneous venous pulsations are present. Visual fields are full to finger confrontation. Extraocular movements are intact. Trigeminal sensation is intact and the muscles of mastication are normal. The face is symmetric. The palate elevates in the midline. Hearing intact. Voice is normal. Shoulder shrug is normal. The tongue has normal motion without fasciculations.   Coordination:  Normal finger to nose and heel to shin. Normal rapid alternating movements.   Gait:  Difficulty with Heel-toe, imbalance with tandem.   Motor Observation:  No asymmetry, no atrophy, and no involuntary movements noted. Tone:  Normal muscle tone.   Posture:  Posture is normal. normal erect   Strength:  Strength is V/V in the upper and lower limbs.    Sensation: intact to LT   Reflex Exam:  DTR's:  Deep tendon reflexes in the upper and lower extremities are normal bilaterally.  Toes:  The toes are downgoing bilaterally.  Clonus:  Clonus is absent.      Assessment/Plan: 20 y.o. female here as a referral from Dr. Andrey Campanile for seizures. 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   Continue keppra for 2 weeks while titrating Lamictal 3-day eeg video EEG was abnormal please see history of present illness Patient is unable to drive, operate heavy machinery, perform activities at heights or participate in water activities until 6 months seizure free Call EMS with any other seizure-like activity  Discussed SUDEP   start Lamotrigine with a goal dose  of 100mg  by mouth twice a day Week 1: Take 25mg  (one pill) by mouth at night every day Week 2: Take 25mg  (one pill) by mouth in the morning and night every day Week 3: Take 50mg  (two pills) by mouth every night and 25mg  every morning  Week 4: Take 50mg  (two pills) by mouth every night and every morning  Week 5: Take 75mg  (three pills) every night and take 50 mg (two pills) every morning  Week 6: Take 75mg  (three pills) every night and morning  Week 7: Take 100mg  (four pills) every night and take 75mg  (three pills) every morning  Week 8: Take 100mg  (four  pills) every night and morning  Please call with any questions or if you develop a rash call immediately and stop taking the Lamotrigine.   Cc: fred wilson  WARNING/CAUTION: Even though it may be rare, some people may have very bad and sometimes deadly side effects when taking a drug. Tell your doctor or get medical help right away if you have any of the following signs or symptoms that may be related to a very bad side effect: . Signs of an allergic reaction, like rash; hives; itching; red, swollen, blistered, or peeling skin with or without fever; wheezing; tightness in the chest or throat; trouble breathing or talking; unusual hoarseness; or swelling of the mouth, face, lips, tongue, or throat. . Signs of infection like fever, chills, very bad sore throat, ear or sinus pain, cough, more sputum or change in color of sputum, pain with passing urine, mouth sores, or wound that will not heal. . Signs of liver problems like dark urine, feeling tired, not hungry, upset stomach or stomach pain, light-colored stools, throwing up, or yellow skin or eyes. . Signs of kidney problems like unable to pass urine, change in how much urine is passed, blood in the urine, or a big weight gain. Marland Kitchen. Shortness of breath, a big weight gain, or swelling in the arms or legs. . If seizures are worse or not the same after starting this drug. . Very bad muscle pain or weakness. .  Very bad joint pain or swelling. . Any unexplained bruising or bleeding. . Change in eyesight. . Very bad dizziness or passing out. . Change in balance. . Not able to control eye movements. . Chest pain or pressure. . Flu-like signs. . This drug may raise the chance of a very bad brain problem called aseptic meningitis. Call your doctor right away if you have a headache, fever, chills, very upset stomach or throwing up, stiff neck, rash, bright lights bother your eyes, feeling sleepy, or feeling confused.  All drugs may cause side effects. However, many people have no side effects or only have minor side effects. Call your doctor or get medical help if any of these side effects or any other side effects bother you or do not go away: . Dizziness. . Feeling sleepy. Marland Kitchen. Upset stomach or throwing up. . Feeling tired or weak. . Shakiness. . Not able to sleep. . Runny nose. . Loose stools (diarrhea). These are not all of the side effects that may occur. If you have questions about side effects, call your doctor. Call your doctor for medical advice about side effects. You may report side effects to your national health agency  Patient is unable to drive, operate heavy machinery, perform activities at heights or participate in water activities until 6 months seizure free   Naomie DeanAntonia Ahern, MD  [facility] [address] [place], [place] [id number]  Phone [phone] Fax [phone]  A total of 40 minutes was spent face-to-face with this patient. Over half this time was spent on counseling patient on the seizure diagnosis and different diagnostic and therapeutic options available.

## 2015-10-28 ENCOUNTER — Encounter: Payer: Self-pay | Admitting: Neurology

## 2015-10-28 DIAGNOSIS — IMO0002 Reserved for concepts with insufficient information to code with codable children: Secondary | ICD-10-CM | POA: Insufficient documentation

## 2015-10-28 DIAGNOSIS — G40209 Localization-related (focal) (partial) symptomatic epilepsy and epileptic syndromes with complex partial seizures, not intractable, without status epilepticus: Secondary | ICD-10-CM | POA: Insufficient documentation

## 2015-12-12 ENCOUNTER — Ambulatory Visit (INDEPENDENT_AMBULATORY_CARE_PROVIDER_SITE_OTHER): Payer: Medicaid Other | Admitting: Neurology

## 2015-12-12 ENCOUNTER — Encounter: Payer: Self-pay | Admitting: Neurology

## 2015-12-12 VITALS — BP 84/53 | HR 66 | Ht 62.0 in | Wt 99.5 lb

## 2015-12-12 DIAGNOSIS — G40109 Localization-related (focal) (partial) symptomatic epilepsy and epileptic syndromes with simple partial seizures, not intractable, without status epilepticus: Secondary | ICD-10-CM | POA: Diagnosis not present

## 2015-12-12 NOTE — Patient Instructions (Signed)
Remember to drink plenty of fluid, eat healthy meals and do not skip any meals. Try to eat protein with a every meal and eat a healthy snack such as fruit or nuts in between meals. Try to keep a regular sleep-wake schedule and try to exercise daily, particularly in the form of walking, 20-30 minutes a day, if you can.   As far as your medications are concerned, I would like to suggest:  Lamictal 100mg  twice daily  I would like to see you back in 6 months, sooner if we need to. Please call us with any interim questions, concerns, problems, updates or refill requests.   Our phone number is [phone]. We also have an after hours call service for urgent matters and there is a physician on-call for urgent questions. For any emergencies you know to call 911 or go to the nearest emergency room

## 2015-12-12 NOTE — Progress Notes (Signed)
[facility]    Provider: Dr Lucia Gaskins Referring Provider: Barbie Banner, MD Primary Care Physician: Pamelia Hoit, MD  CC: Seizure  Interval history 12/12/2015:  20 y.o. female here as a referral from Dr. Andrey Campanile for seizures. 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 Three-day prolonged EEG was abnormal with right-sided sharp epileptiform discharges seen consistent with her left-sided symptoms. She was started on Lamictal. Feeling better. She has had a few episodes, but not like before. She is in the process of titrating and is on  in the morning and  at night. Her blood pressure has been low. She doesn't drink in between meals. She drinks at meals only, maybe only 10oz a day. She is not drinking enough fluids. Friday she was dizzy, she gets light headed and nauseated, like she is going to pass out. Her BP is low today 84/53. They have a hard time getting her to eat and drink. Discussed taking in more fluids and salt. Her mood is better on the Lamictal. She feels happier, mood is better, not tired unless she has a full day. The last time she had an episode was several weeks ago and she was not on the full dose of the medication.  No sides effects to the Lamictal.   Interval history 10/26/2015: This is a lovely patient is here with her dad for follow-up after a 3 day ambulatory video EEG that was abnormal. Patient was started on Keppra at last appointment however she is having side effects including behavior changes. Her long discussion about epilepsy with father and patient. We'll start Lamictal. Lamictal is a great medication for both seizures and is also used for mood disorders so does not cause behavioral side effects like the Keppra. We'll continue Keppra while  we titrating up on Lamictal.  PHYSICAN  EEG CONCLUSION/IMPRESSION: This was an abnormal prolonged ambulatory 72-hour video EEG. Sharp epileptiform discharges seen in the right hemisphere -right fronto-temporal (F8/T4/T6), temporal (T4/T6 regions). These discharges were present while awake but became more frequent during sleep. Also seen during sleep were isolated bursts of sharp (as well as spike and wave), generalized, frontally-predominant discharges. These discharges were fairly frequent. No electrographic or electroclinical events present. There was no focal or background slowing seen.  There were 4 push button events. These did not correlate with any changes on the EEG.  Owing to this prolonged VEEG being abnormal with focal abnormalities (right hemispheric discharges), if not recently obtained, a brain MRI and optimization of the anticonvulsant medication is recommended.  HPI: Haniyah Maciolek is a 20 y.o. female here as a referral from Dr. Andrey Campanile for seizures. Has a history of developmental delay and seizures, she was not on medication when see in the ED 09/08/2015 for gtcs. Never been on AEDs. Parents here and provide most information. She has anorexia. BP in the Ed was 86/69. She also has reported staring spells. She had her first seizure as an infant at 6 weeks. Then not again until 2 years ago when she had an episode of numbness on the left side and inability to speak. She had another one a year ago and then recently. Last year she passed out eating lunch, they were told she had a seizure and her blood glucose was 40 and described as shaking on the floor. Teacher said this  was def a seizure in March or April. 2 years ago it happened at the table c/o numbness on the left side face and arm, speech become impaired then it passes but she is aware and responsive and interactive and these happen sporadically once in a month. Last Friday she was getting ready for work, dad heard a fall and  vocalizations, she was twitching and on her side and had vomited, she was not responsive, no tongue biting, she was breathing, she was trying to speak but couldn't and trying to get up. Eyes were closed and fluttering a little, she heard his voice. She was sitting up in a few minutes and disoriented.She remembers eating and she noticed numbness in the left hand and left face and remembers falling and hitting the floor but she is not sure the next thing she remembers is the paramedics around her. They started her on Keppra and sh eis a litle sleepy. She graduated high school. She is taking some online classes. She is seeing a therapist and has been told these episodes are due to stress.   Reviewed notes, labs and imaging from outside physicians, which showed:  Patient seen in the ED 09/08/2015. Per notes presenting to the ED with a grand mal seizure that occurred just PTA. Witnessed by patient's father, now at the bedside. Father states that the patient was sitting on a chair in the kitchen, fell to the floor, and began having full body convulsions. Positive for urinary incontinence and vomiting. Patient also complains of abdominal pain, which she indicates is chronic. Patient states that she hurts "a little bit." Pain is nonradiating and is accompanied by nausea. Pt has a history of seizures, but has not had one for over a year, and is not on medication for them. Pt and patient's parents confirm that the patient has never been on seizure medication before. 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 Put on OCPs and increased diet, period returned. EMS reports pt was CAOx4 upon their arrival with no seizure activity noted. Patient's parents at the bedside confirm pt is behaving normally. Pt is currently menstruating. Patient's parents also state that the patient has had  intermittent "episodes" that include the patient's face unilaterally becoming numb, followed by a period of 30 seconds to 1 minute where the patient stares straight ahead and is not arousable.Her BP was 86/69 but she was asymptomatic.   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 Smaller areas of chronic infarction in the occipital lobes bilaterally. No acute abnormality.  IMPRESSION: No acute abnormality head, face or cervical spine. Remote bilateral parietal and occipital lobe infarcts.  Seen in the ED 2/3. Urine rapid drug screen negative, pregnancy test negative, CMP and CBC normal, EtOH negative.   Her emergency room notes on 09/08/2015, patient presented to the ED with reports of generalized tonic-clonic seizure witnessed by patient's father. Father states that the patient was sitting on a chair in the kitchen and fell to the floor and began having full body convulsions. Positive for urinary incontinence and vomiting. EMS reported patient was alert and oriented 4 upon their arrival with no seizure activity noted. Patient was at baseline in the emergency room. He spoke with Dr. Amada Jupiter who started the patient  on 500 mg twice a day of Keppra with outpatient follow-up in neurology.     Social History   Social History  . Marital Status: Single    Spouse Name: N/A  . Number of Children: 0  . Years of Education: 12   Occupational History  . [employer]    Social History Main Topics  . Smoking status: Never Smoker   . Smokeless tobacco: Not on file  . Alcohol Use: No  . Drug Use: No  . Sexual Activity: Not on file   Other Topics Concern  . Not on file   Social History Narrative   Lives with parents   Caffeine use: daily iced tea    Family History  Problem Relation Age of Onset  . Diabetes Mother   . Breast cancer Maternal Grandmother   .  Breast cancer Paternal Grandmother   . Heart disease Maternal Grandfather   . Seizures Neg Hx     Past Medical History  Diagnosis Date  . Seizures (HCC)     655 weeks old  . Hx of foot surgery 2008    reconstruction- right     Past Surgical History  Procedure Laterality Date  . Foot surgery Right 2008    reconstruction    Current Outpatient Prescriptions  Medication Sig Dispense Refill  . lamoTRIgine (LAMICTAL) 25 MG CHEW chewable tablet Chew 4 tablets (100 mg total) by mouth 2 (two) times daily. 240 tablet 11  . Norgestim-Eth Estrad Triphasic (TRI-SPRINTEC PO) Take 1 tablet by mouth daily with supper.     No current facility-administered medications for this visit.    Allergies as of 12/12/2015  . (No Known Allergies)    Vitals: BP 84/53 mmHg  Pulse 66  Ht 5\' 2"  (1.575 m)  Wt 99 lb 8 oz (45.133 kg)  BMI 18.19 kg/m2 Last Weight:  Wt Readings from Last 1 Encounters:  12/12/15 99 lb 8 oz (45.133 kg)   Last Height:   Ht Readings from Last 1 Encounters:  12/12/15 5\' 2"  (1.575 m)     Speech:  Speech is normal; fluent and spontaneous with normal comprehension.  Cognition:  The patient is oriented to person, place, and time;   recent and remote memory intact;   language fluent;   normal attention, concentration,   fund of knowledge impaired  Cranial Nerves:  The pupils are equal, round, and reactive to light. The fundi are normal and spontaneous venous pulsations are present. Visual fields are full to finger confrontation. Extraocular movements are intact. Trigeminal sensation is intact and the muscles of mastication are normal. The face is symmetric. The palate elevates in the midline. Hearing intact. Voice is normal. Shoulder shrug is normal. The tongue has normal motion without fasciculations.   Coordination:  Normal finger to nose and heel to shin. Normal rapid alternating movements.   Gait:  Difficulty with Heel-toe, imbalance with  tandem.   Motor Observation:  No asymmetry, no atrophy, and no involuntary movements noted. Tone:  Normal muscle tone.   Posture:  Posture is normal. normal erect   Strength:  Strength is V/V in the upper and lower limbs.    Sensation: intact to LT   Reflex Exam:  DTR's:  Deep tendon reflexes in the upper and lower extremities are normal bilaterally.  Toes:  The toes are downgoing bilaterally.  Clonus:  Clonus is absent.      Assessment/Plan: 20 y.o. female here as a referral from Dr. Andrey CampanileWilson for seizures. 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 Three-day prolonged EEG was abnormal with right-sided sharp epileptiform discharges seen consistent with her left-sided symptoms.   Continue Lamictal 100mg  twice daily 3-day eeg video EEG was abnormal please see history of present illness Patient is unable to drive, operate heavy machinery, perform activities at heights or participate in water activities until 6 months seizure free Call EMS with any other seizure-like activity  Discussed SUDEP. Discussed Patients with epilepsy have a small risk of sudden unexpected death, a condition referred to as sudden unexpected death in epilepsy (SUDEP). SUDEP is defined specifically as the sudden, unexpected, witnessed or unwitnessed, nontraumatic and nondrowning death in patients with epilepsy with or without evidence for a seizure, and excluding documented status epilepticus, in which post mortem examination does not reveal a structural or toxicologic cause for death    Naomie Dean, MD  [facility] [address] [place], [place] [address]  Phone [phone] Fax [phone]  A total of 30 minutes was spent  face-to-face with this patient. Over half this time was spent on counseling patient on the partial epilepsy diagnosis and different diagnostic and therapeutic options available.

## 2016-01-26 ENCOUNTER — Encounter: Payer: Self-pay | Admitting: Internal Medicine

## 2016-04-01 ENCOUNTER — Ambulatory Visit (INDEPENDENT_AMBULATORY_CARE_PROVIDER_SITE_OTHER): Payer: Medicaid Other | Admitting: Internal Medicine

## 2016-04-01 ENCOUNTER — Encounter: Payer: Self-pay | Admitting: Internal Medicine

## 2016-04-01 VITALS — BP 80/54 | HR 68 | Ht 62.0 in | Wt 99.0 lb

## 2016-04-01 DIAGNOSIS — R1013 Epigastric pain: Secondary | ICD-10-CM | POA: Diagnosis not present

## 2016-04-01 NOTE — Progress Notes (Signed)
HISTORY OF PRESENT ILLNESS:  Martha Wells is a 20 y.o. 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 Benedetto GoadFred Wilson for evaluation of chronic postprandial abdominal pain. Patient is accompanied by her father who assists with the history. It appears the patient has had issues with postprandial abdominal discomfort for several years. She's had difficulty with her weight. I'm told that one point prior to the development of symptoms she weighed 112 pounds but subsequently fell to 90 pounds. Currently 99 pounds. She has difficulty describing her discomfort but reports that it is mid abdominal, occurs after most meals (especially dinner) and is described as sharp and I'll. No associated change in bowel habits. There is occasional nausea but no vomiting. At one point she did have food avoidance. No nocturnal symptoms. No symptoms when not eating. She recent he tried sublingual Levsin which upset her stomach. No additional evaluations. She did have a seizure back in February for which she was initially started on Keppra which was subsequently switched to Lamictal. She is also on birth control pill because of amenorrhea. Blood work from February 2017 reveals unremarkable comprehensive metabolic panel and CBC with differential.  REVIEW OF SYSTEMS:  All non-GI ROS negative except for seizures  Past Medical History:  Diagnosis Date  . Avoidant or restrictive food intake disorder   . Hx of foot surgery 2008   reconstruction- right   . Seizures (HCC)    945 weeks old    Past Surgical History:  Procedure Laterality Date  . FOOT SURGERY Right 2008   reconstruction  . TYMPANOSTOMY TUBE PLACEMENT      Social History Martha ShutterKayla Ester Ishii  reports that she has never smoked. She has never used smokeless tobacco. She reports that she does not drink alcohol or use drugs.  family history includes Breast  cancer in her maternal grandmother and paternal grandmother; Diabetes in her mother; Heart disease in her maternal grandfather.  No Known Allergies     PHYSICAL EXAMINATION: Vital signs: BP (!) 80/54   Pulse 68   Ht 5\' 2"  (1.575 m)   Wt 99 lb (44.9 kg)   BMI 18.11 kg/m   Constitutional: Thin female, generally well-appearing, no acute distress Psychiatric: alert and oriented x3, cooperative Eyes: extraocular movements intact, anicteric, conjunctiva pink Mouth: oral pharynx moist, no lesions Neck: supple no lymphadenopathy Cardiovascular: heart regular rate and rhythm, no murmur Lungs: clear to auscultation bilaterally Abdomen: soft, nontender, nondistended, no obvious ascites, no peritoneal signs, normal bowel sounds, no organomegaly Rectal:Omitted Extremities: no clubbing cyanosis or lower extremity edema bilaterally Skin: no lesions on visible extremities Neuro: Slow speech. Impaired body movements. Normal deep tendon reflexes.   ASSESSMENT:  #1. Chronic postprandial abdominal discomfort with fluctuations in body weight. Possible causes include peptic ulcer disease, GERD, gallbladder disease, and functional causes #2. Behavioral issues, namely anxiety #3. History of seizure disorder   PLAN:  #1. Abdominal ultrasound to evaluate pain #2. EGD to evaluate pain.The nature of the procedure, as well as the risks, benefits, and alternatives were carefully and thoroughly reviewed with the patient. Ample time for discussion and questions allowed. The patient understood, was satisfied, and agreed to proceed. #3. Consider empiric PPI if the above negative  A copy of this consultation note has been sent to Dr. Benedetto GoadFred Wilson #4. If GI workup negative and no response to empiric GI medications, consider functional etiologies at which point I  would recommend her PCP refer her back to her behavioral professional

## 2016-04-01 NOTE — Patient Instructions (Signed)
You have been scheduled for an abdominal ultrasound at [facility] (1st floor of hospital) on 04/09/16 at 9 am. Please arrive 15 minutes prior to your appointment for registration. Make certain not to have anything to eat or drink 6 hours prior to your appointment. Should you need to reschedule your appointment, please contact radiology at [phone]. This test typically takes about 30 minutes to perform.  You have been scheduled for an endoscopy. Please follow written instructions given to you at your visit today. If you use inhalers (even only as needed), please bring them with you on the day of your procedure. Your physician has requested that you go to www.startemmi.com and enter the access code given to you at your visit today. This web site gives a general overview about your procedure. However, you should still follow specific instructions given to you by our office regarding your preparation for the procedure.

## 2016-04-09 ENCOUNTER — Ambulatory Visit (HOSPITAL_COMMUNITY)
Admission: RE | Admit: 2016-04-09 | Discharge: 2016-04-09 | Disposition: A | Discharge status: Home / Self Care | Payer: Medicaid Other | Source: Ambulatory Visit | Attending: Internal Medicine | Admitting: Internal Medicine

## 2016-04-09 DIAGNOSIS — R1013 Epigastric pain: Secondary | ICD-10-CM | POA: Diagnosis not present

## 2016-04-22 ENCOUNTER — Encounter: Payer: Medicaid Other | Admitting: Internal Medicine

## 2016-05-01 ENCOUNTER — Ambulatory Visit (AMBULATORY_SURGERY_CENTER): Payer: Medicaid Other | Admitting: Internal Medicine

## 2016-05-01 ENCOUNTER — Encounter: Payer: Self-pay | Admitting: Internal Medicine

## 2016-05-01 VITALS — BP 94/42 | HR 51 | Temp 98.9°F | Resp 14 | Ht 62.0 in | Wt 99.0 lb

## 2016-05-01 DIAGNOSIS — R1013 Epigastric pain: Secondary | ICD-10-CM

## 2016-05-01 MED ORDER — OMEPRAZOLE 20 MG PO CPDR: 20.0000 mg | DELAYED_RELEASE_CAPSULE | Freq: Every day | ORAL | 2 refills | Status: DC

## 2016-05-01 MED ORDER — SODIUM CHLORIDE 0.9 % IV SOLN: 500.0000 mL | INTRAVENOUS | Status: DC

## 2016-05-01 NOTE — Op Note (Signed)
[facility] Patient Name: Martha Wells Procedure Date: 05/01/2016 10:04 AM MRN: [id number] Endoscopist: Martha Wells. Martha Wells , MD Age: 20 Referring MD:  Date of Birth: 1995-10-21 Gender: Female Account #: [id number] Procedure:                Upper GI endoscopy Indications:              Abdominal pain Medicines:                Monitored Anesthesia Care Procedure:                Pre-Anesthesia Assessment:                           - Prior to the procedure, a History and Physical                            was performed, and patient medications and                            allergies were reviewed. The patient's tolerance of                            previous anesthesia was also reviewed. The risks                            and benefits of the procedure and the sedation                            options and risks were discussed with the patient.                            All questions were answered, and informed consent                            was obtained. Prior Anticoagulants: The patient has                            taken no previous anticoagulant or antiplatelet                            agents. ASA Grade Assessment: II - A patient with                            mild systemic disease. After reviewing the risks                            and benefits, the patient was deemed in                            satisfactory condition to undergo the procedure.                           After obtaining informed consent, the endoscope was  passed under direct vision. Throughout the                            procedure, the patient's blood pressure, pulse, and                            oxygen saturations were monitored continuously. The                            Model GIF-[id number] scope was introduced                            through the mouth, and advanced to the second part                            of duodenum. The upper GI endoscopy  was                            accomplished without difficulty. The patient                            tolerated the procedure well. Scope In: Scope Out: Findings:                 The esophagus was normal.                           The stomach was normal.                           The examined duodenum was normal.                           The cardia and gastric fundus were normal on                            retroflexion. Complications:            No immediate complications. Estimated Blood Loss:     Estimated blood loss: none. Impression:               - Normal EGD                           - Suspect chronic abdominal pain is functional. Recommendation:           - Prescribe omeprazole 20 mg daily; #30; 2 refills.                            Take this medication once daily for 4 weeks. If you                            do not tolerated or if it does not help, then                            discontinue and see your primary care provider for  treatment of anxiety as this may help. Abdominal                            complaints. however, If this medication does help                            then continue.                           - Resume previous diet. Martha BonitoJohn N. Martha GoodellPerry, MD 05/01/2016 10:26:34 AM This report has been signed electronically.

## 2016-05-01 NOTE — Patient Instructions (Signed)
Findings:  Normal EGD Recommendations:  Omeprazole 20 mg daily. Take for 4 weeks. If not tolerated or if not helping, then discontinue and see your primary care provider. Resume previous diet.   YOU HAD AN ENDOSCOPIC PROCEDURE TODAY AT THE [facility]:   Refer to the procedure report that was given to you for any specific questions about what was found during the examination.  If the procedure report does not answer your questions, please call your gastroenterologist to clarify.  If you requested that your care partner not be given the details of your procedure findings, then the procedure report has been included in a sealed envelope for you to review at your convenience later.  YOU SHOULD EXPECT: Some feelings of bloating in the abdomen. Passage of more gas than usual.  Walking can help get rid of the air that was put into your GI tract during the procedure and reduce the bloating. If you had a lower endoscopy (such as a colonoscopy or flexible sigmoidoscopy) you may notice spotting of blood in your stool or on the toilet paper. If you underwent a bowel prep for your procedure, you may not have a normal bowel movement for a few days.  Please Note:  You might notice some irritation and congestion in your nose or some drainage.  This is from the oxygen used during your procedure.  There is no need for concern and it should clear up in a day or so.  SYMPTOMS TO REPORT IMMEDIATELY:   Following lower endoscopy (colonoscopy or flexible sigmoidoscopy):  Excessive amounts of blood in the stool  Significant tenderness or worsening of abdominal pains  Swelling of the abdomen that is new, acute  Fever of 100F or higher   Following upper endoscopy (EGD)  Vomiting of blood or coffee ground material  New chest pain or pain under the shoulder blades  Painful or persistently difficult swallowing  New shortness of breath  Fever of 100F or higher  Black, tarry-looking stools  For urgent or  emergent issues, a gastroenterologist can be reached at any hour by calling [phone].   DIET:  We do recommend a small meal at first, but then you may proceed to your regular diet.  Drink plenty of fluids but you should avoid alcoholic beverages for 24 hours.  ACTIVITY:  You should plan to take it easy for the rest of today and you should NOT DRIVE or use heavy machinery until tomorrow (because of the sedation medicines used during the test).    FOLLOW UP: Our staff will call the number listed on your records the next business day following your procedure to check on you and address any questions or concerns that you may have regarding the information given to you following your procedure. If we do not reach you, we will leave a message.  However, if you are feeling well and you are not experiencing any problems, there is no need to return our call.  We will assume that you have returned to your regular daily activities without incident.  If any biopsies were taken you will be contacted by phone or by letter within the next 1-3 weeks.  Please call us at [phone] if you have not heard about the biopsies in 3 weeks.    SIGNATURES/CONFIDENTIALITY: You and/or your care partner have signed paperwork which will be entered into your electronic medical record.  These signatures attest to the fact that that the information above on your After Visit Summary  has been reviewed and is understood.  Full responsibility of the confidentiality of this discharge information lies with you and/or your care-partner.  Please follow all discharge instructions given to you by the recovery room nurse. If you have any questions or problems after discharge please call the number listed above. You will receive a phone call in the am to see how you are doing and answer any questions you may have. Thank you for choosing [facility] for your health care needs.

## 2016-05-01 NOTE — Progress Notes (Signed)
Report to PACU, RN, vss, BBS= Clear.  

## 2016-05-02 ENCOUNTER — Telehealth: Payer: Self-pay

## 2016-05-02 ENCOUNTER — Telehealth: Payer: Self-pay | Admitting: *Deleted

## 2016-05-02 NOTE — Telephone Encounter (Signed)
  Follow up Call-  Call back number 05/01/2016  Post procedure Call Back phone  # [phone]  Permission to leave phone message Yes  Some recent data might be hidden     Patient questions:  Do you have a fever, pain , or abdominal swelling? No. Pain Score  0 *  Have you tolerated food without any problems? Yes.    Have you been able to return to your normal activities? Yes.    Do you have any questions about your discharge instructions: Diet   No. Medications  No. Follow up visit  No.  Do you have questions or concerns about your Care? No.  Actions: * If pain score is 4 or above: No action needed, pain <4.

## 2016-05-02 NOTE — Telephone Encounter (Signed)
  Follow up Call-  Call back number 05/01/2016  Post procedure Call Back phone  # [phone]  Permission to leave phone message Yes  Some recent data might be hidden     No answer, left message.

## 2016-05-02 NOTE — Telephone Encounter (Signed)
  Follow up Call-  Call back number 05/01/2016  Post procedure Call Back phone  # [phone]  Permission to leave phone message Yes  Some recent data might be hidden    Patient was called for follow up after her procedure on 05/01/2016. No answer at the number given for follow up phone call. This was the second attempt to reach the patient. A message was left on the home phone answering machine. The cell phone did not have a voice mail.

## 2016-06-17 ENCOUNTER — Ambulatory Visit: Payer: Medicaid Other | Admitting: Neurology

## 2016-07-02 ENCOUNTER — Encounter: Payer: Self-pay | Admitting: Neurology

## 2016-07-02 ENCOUNTER — Ambulatory Visit (INDEPENDENT_AMBULATORY_CARE_PROVIDER_SITE_OTHER): Payer: Medicaid Other | Admitting: Neurology

## 2016-07-02 VITALS — BP 82/54 | HR 78 | Ht 62.0 in | Wt 101.4 lb

## 2016-07-02 DIAGNOSIS — R569 Unspecified convulsions: Secondary | ICD-10-CM | POA: Diagnosis not present

## 2016-07-02 MED ORDER — LAMOTRIGINE 25 MG PO CHEW: 125.0000 mg | CHEWABLE_TABLET | Freq: Two times a day (BID) | ORAL | 11 refills | Status: DC

## 2016-07-02 NOTE — Patient Instructions (Addendum)
Remember to drink plenty of fluid, eat healthy meals and do not skip any meals. Try to eat protein with a every meal and eat a healthy snack such as fruit or nuts in between meals. Try to keep a regular sleep-wake schedule and try to exercise daily, particularly in the form of walking, 20-30 minutes a day, if you can.   As far as your medications are concerned, I would like to suggest: Increase Lamictal to 125mg  twice daily (5 pills) Week one: 125mg  at night (5 pills at night) and 100mg  in the morning(4 pills) Week two: 125mg  twice daily (5x25mg  pills)  I would like to see you back in 6 months, sooner if we need to. Please call us with any interim questions, concerns, problems, updates or refill requests.   Our phone number is [phone]. We also have an after hours call service for urgent matters and there is a physician on-call for urgent questions. For any emergencies you know to call 911 or go to the nearest emergency room  Raynaud Phenomenon Raynaud phenomenon is a condition that affects the blood vessels (arteries) that carry blood to your fingers and toes. The arteries that supply blood to your ears or the tip of your nose might also be affected. Raynaud phenomenon causes the arteries to temporarily narrow. As a result, the flow of blood to the affected areas is temporarily decreased. This usually occurs in response to cold temperatures or stress. During an attack, the skin in the affected areas turns white. You may also feel tingling or numbness in those areas. Attacks usually last for only a brief period, and then the blood flow to the area returns to normal. In most cases, Raynaud phenomenon does not cause serious health problems. CAUSES  For many people with this condition, the cause is not known. Raynaud phenomenon is sometimes associated with other diseases, such as scleroderma or lupus. RISK FACTORS Raynaud phenomenon can affect anyone, but it develops most often in people who are  5220-20 years old. It affects more females than males. SIGNS AND SYMPTOMS Symptoms of Raynaud phenomenon may occur when you are exposed to cold temperatures or when you have emotional stress. The symptoms may last for a few minutes or up to several hours. They usually affect your fingers but may also affect your toes, ears, or the tip of your nose. Symptoms may include:  Changes in skin color. The skin in the affected areas will turn pale or white. The skin may then change from white to bluish to red as normal blood flow returns to the area.  Numbness, tingling, or pain in the affected areas. In severe cases, sores may develop in the affected areas.  DIAGNOSIS  Your health care provider will do a physical exam and take your medical history. You may be asked to put your hands in cold water to check for a reaction to cold temperature. Blood tests may be done to check for other diseases or conditions. Your health care provider may also order a test to check the movement of blood through your arteries and veins (vascular ultrasound). TREATMENT  Treatment often involves making lifestyle changes and taking steps to control your exposure to cold temperatures. For more severe cases, medicine (calcium channel blockers) may be used to improve blood flow. Surgery is sometimes done to block the nerves that control the affected arteries, but this is rare. HOME CARE INSTRUCTIONS   Avoid exposure to cold by taking these steps:  If possible, stay indoors during  cold weather.  When you go outside during cold weather, dress in layers and wear mittens, a hat, a scarf, and warm footwear.  Wear mittens or gloves when handling ice or frozen food.  Use holders for glasses or cans containing cold drinks.  Let warm water run for a while before taking a shower or bath.  Warm up the car before driving in cold weather.  If possible, avoid stressful and emotional situations. Exercise, meditation, and yoga may help you  cope with stress. Biofeedback may be useful.  Do not use any tobacco products, including cigarettes, chewing tobacco, or electronic cigarettes. If you need help quitting, ask your health care provider.  Avoid secondhand smoke.  Limit your use of caffeine. Switch to decaffeinated coffee, tea, and soda. Avoid chocolate.  Wear loose fitting socks and comfortable, roomy shoes.  Avoid vibrating tools and machinery.  Take medicines only as directed by your health care provider. SEEK MEDICAL CARE IF:   Your discomfort becomes worse despite lifestyle changes.  You develop sores on your fingers or toes that do not heal.  Your fingers or toes turn black.  You have breaks in the skin on your fingers or toes.  You have a fever.  You have pain or swelling in your joints.  You have a rash.  Your symptoms occur on only one side of your body. This information is not intended to replace advice given to you by your health care provider. Make sure you discuss any questions you have with your health care provider. Document Released: 07/19/2000 Document Revised: 08/12/2014 Document Reviewed: 02/24/2014 Elsevier Interactive Patient Education  2017 [employer]

## 2016-07-02 NOTE — Progress Notes (Signed)
[name]    Provider:  Dr Lucia Gaskins Referring Provider: Barbie Banner, MD Primary Care Physician:  Pamelia Hoit, MD   CC: Seizure  Interval History 07/02/2016: She is doing well, no more seizures. They had a wonderful thanksgiving. She has had some numbness and blanking out. She felt a little numbness. She felt like she was going to have a seizure but did not have a seizure. Twice. Fingers are getting cold and the tips get white. Discussed we will increase her Lamictal to 125mg  twice daily. Discussed Raynaud's phenomenon.   Interval history 12/12/2015:  20 y.o. female here as a referral from Dr. Andrey Campanile for seizures. 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 Three-day prolonged EEG was abnormal with right-sided sharp epileptiform discharges seen consistent with her left-sided symptoms. She was started on Lamictal. Feeling better. She has had a few episodes, but not like before. She is in the process of titrating and is on 100mg  in the morning and 75mg  at night. Her blood pressure has been low. She doesn't drink in between meals. She drinks at meals only, maybe only 10oz a day. She is not drinking enough fluids. Friday she was dizzy, she gets light headed and nauseated, like she is going to pass out. Her BP is low today 84/53. They have a hard time getting her to eat and drink. Discussed taking in more fluids and salt. Her mood is better on the Lamictal. She feels happier, mood is better, not tired unless she has a full day. The last time she had an episode was several weeks ago and she was not on the full dose of the medication.  No sides effects to the Lamictal.   Interval history 10/26/2015: This is a lovely patient is here with her dad for follow-up after a 3 day  ambulatory video EEG that was abnormal. Patient was started on Keppra at last appointment however she is having side effects including behavior changes. Her long discussion about epilepsy with father and patient. We'll start Lamictal. Lamictal is a great medication for both seizures and is also used for mood disorders so does not cause behavioral side effects like the Keppra. We'll continue Keppra while we titrating up on Lamictal.  PHYSICAN  EEG CONCLUSION/IMPRESSION: This was an abnormal prolonged ambulatory 72-hour video EEG. Sharp epileptiform discharges seen in the right hemisphere -right fronto-temporal (F8/T4/T6), temporal (T4/T6 regions). These discharges were present while awake but became more frequent during sleep. Also seen during sleep were isolated bursts of sharp (as well as spike and wave), generalized, frontally-predominant discharges. These discharges were fairly frequent. No electrographic or electroclinical events present. There was no focal or background slowing seen.  There were 4 push button events. These did not correlate with any changes on the EEG.  Owing to this prolonged VEEG being abnormal with focal abnormalities (right hemispheric discharges), if not recently obtained, a brain MRI and optimization of the anticonvulsant medication is recommended.  HPI: Martha Wells is a 20 y.o. female here as a referral from Dr. Andrey Campanile for seizures. Has a history of developmental delay and seizures, she was not on medication when see in the ED 09/08/2015 for gtcs. Never been on AEDs. Parents here and provide most information. She has anorexia. BP in the Ed was 86/69. She also has reported staring spells. She  had her first seizure as an infant at 6 weeks. Then not again until 2 years ago when she had an episode of numbness on the left side and inability to speak. She had another one a year ago and then recently. Last year she passed out eating lunch, they were told she had a seizure  and her blood glucose was 40 and described as shaking on the floor. Teacher said this was def a seizure in March or April. 2 years ago it happened at the table c/o numbness on the left side face and arm, speech become impaired then it passes but she is aware and responsive and interactive and these happen sporadically once in a month. Last Friday she was getting ready for work, dad heard a fall and vocalizations, she was twitching and on her side and had vomited, she was not responsive, no tongue biting, she was breathing, she was trying to speak but couldn't and trying to get up. Eyes were closed and fluttering a little, she heard his voice. She was sitting up in a few minutes and disoriented.She remembers eating and she noticed numbness in the left hand and left face and remembers falling and hitting the floor but she is not sure the next thing she remembers is the paramedics around her. They started her on Keppra and sh eis a litle sleepy. She graduated high school. She is taking some online classes. She is seeing a therapist and has been told these episodes are due to stress.   Reviewed notes, labs and imaging from outside physicians, which showed:  Patient seen in the ED 09/08/2015. Per notes presenting to the ED with a grand mal seizure that occurred just PTA. Witnessed by patient's father, now at the bedside. Father states that the patient was sitting on a chair in the kitchen, fell to the floor, and began having full body convulsions. Positive for urinary incontinence and vomiting. Patient also complains of abdominal pain, which she indicates is chronic. Patient states that she hurts "a little bit." Pain is nonradiating and is accompanied by nausea. Pt has a history of seizures, but has not had one for over a year, and is not on medication for them. Pt and patient's parents confirm that the patient has never been on seizure medication before. Parents add that the patient has been evaluated over the past  few months for decreased appetite, was found to have low estrogen and had not had a period for two years due to anorexia. Put on OCPs and increased diet, period returned. EMS reports pt was CAOx4 upon their arrival with no seizure activity noted. Patient's parents at the bedside confirm pt is behaving normally. Pt is currently menstruating. Patient's parents also state that the patient has had intermittent "episodes" that include the patient's face unilaterally becoming numb, followed by a period of 30 seconds to 1 minute where the patient stares straight ahead and is not arousable.Her BP was 86/69 but she was asymptomatic.   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 Smaller areas of chronic infarction in the occipital lobes bilaterally. No acute abnormality.  IMPRESSION: No acute abnormality head, face or cervical spine. Remote bilateral parietal and occipital lobe infarcts.  Seen in the ED 2/3. Urine rapid drug screen negative, pregnancy test negative, CMP and CBC normal, EtOH negative.   Her emergency room notes on 09/08/2015, patient presented to the ED with  reports of generalized tonic-clonic seizure witnessed by patient's father. Father states that the patient was sitting on a chair in the kitchen and fell to the floor and began having full body convulsions. Positive for urinary incontinence and vomiting. EMS reported patient was alert and oriented 4 upon their arrival with no seizure activity noted. Patient was at baseline in the emergency room. He spoke with Dr. Amada JupiterKirkpatrick who started the patient on 500 mg twice a day of Keppra with outpatient follow-up in neurology.     Social History   Social History  . Marital status: Single    Spouse name: N/A  . Number of children: 0  . Years of education: 12   Occupational History  . [employer]    Social  History Main Topics  . Smoking status: Never Smoker  . Smokeless tobacco: Never Used  . Alcohol use No  . Drug use: No  . Sexual activity: Not on file   Other Topics Concern  . Not on file   Social History Narrative   Lives with parents   Caffeine use: daily iced tea    Family History  Problem Relation Age of Onset  . Diabetes Mother   . Breast cancer Maternal Grandmother   . Breast cancer Paternal Grandmother   . Heart disease Maternal Grandfather   . Seizures Neg Hx   . Colon cancer Neg Hx   . Stomach cancer Neg Hx   . Esophageal cancer Neg Hx   . Pancreatic cancer Neg Hx   . Liver disease Neg Hx     Past Medical History:  Diagnosis Date  . Avoidant or restrictive food intake disorder   . Hx of foot surgery 2008   reconstruction- right   . Seizures (HCC)    715 weeks old    Past Surgical History:  Procedure Laterality Date  . FOOT SURGERY Right 2008   reconstruction  . TYMPANOSTOMY TUBE PLACEMENT      Current Outpatient Prescriptions  Medication Sig Dispense Refill  . lamoTRIgine (LAMICTAL) 25 MG CHEW chewable tablet Chew 4 tablets (100 mg total) by mouth 2 (two) times daily. 240 tablet 11  . Norgestim-Eth Estrad Triphasic (TRI-SPRINTEC PO) Take 1 tablet by mouth daily with supper.     Current Facility-Administered Medications  Medication Dose Route Frequency Provider Last Rate Last Dose  . 0.9 %  sodium chloride infusion  500 mL Intravenous Continuous Hilarie FredricksonJohn N Perry, MD        Allergies as of 07/02/2016  . (No Known Allergies)    Vitals: There were no vitals taken for this visit. Last Weight:  Wt Readings from Last 1 Encounters:  05/01/16 99 lb (44.9 kg)   Last Height:   Ht Readings from Last 1 Encounters:  05/01/16 5\' 2"  (1.575 m)     Speech:  Speech is normal; fluent and spontaneous with normal comprehension.  Cognition:  The patient is oriented to person, place, and time;   recent and remote memory intact;   language fluent;    normal attention, concentration,   fund of knowledge impaired  Cranial Nerves:  The pupils are equal, round, and reactive to light. The fundi are normal and spontaneous venous pulsations are present. Visual fields are full to finger confrontation. Extraocular movements are intact. Trigeminal sensation is intact and the muscles of mastication are normal. The face is symmetric. The palate elevates in the midline. Hearing intact. Voice is normal. Shoulder shrug is normal. The tongue has normal motion without fasciculations.  Coordination:  Normal finger to nose and heel to shin. Normal rapid alternating movements.   Gait:  Difficulty with Heel-toe, imbalance with tandem.   Motor Observation:  No asymmetry, no atrophy, and no involuntary movements noted. Tone:  Normal muscle tone.   Posture:  Posture is normal. normal erect   Strength:  Strength is V/V in the upper and lower limbs.    Sensation: intact to LT   Reflex Exam:  DTR's:  Deep tendon reflexes in the upper and lower extremities are normal bilaterally.  Toes:  The toes are downgoing bilaterally.  Clonus:  Clonus is absent.      Assessment/Plan: 20 y.o. Lovely female here as a referral from Dr. Andrey Campanile for seizures. 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 Three-day prolonged EEG was abnormal with right-sided sharp epileptiform discharges seen consistent with her left-sided symptoms.   Increase Lamictal 125mg  twice daily 3-day eeg video EEG was abnormal (please see history of present illness above) Patient is unable to drive, operate heavy machinery, perform activities at heights or participate in water activities until 6 months seizure free Call EMS with  any other seizure-like activity  Discussed SUDEP. Discussed Patients with epilepsy have a small risk of sudden unexpected death, a condition referred to as sudden unexpected death in epilepsy (SUDEP). SUDEP is defined specifically as the sudden, unexpected, witnessed or unwitnessed, nontraumatic and nondrowning death in patients with epilepsy with or without evidence for a seizure, and excluding documented status epilepticus, in which post mortem examination does not reveal a structural or toxicologic cause for death    Naomie Dean, MD  [facility] [address] [place], [place] [address]  Phone [phone] Fax [phone]  A total of 30 minutes was spent face-to-face with this patient. Over half this time was spent on counseling patient on the partial epilepsy diagnosis and different diagnostic and therapeutic options available.

## 2016-10-08 ENCOUNTER — Telehealth: Payer: Self-pay | Admitting: Neurology

## 2016-10-08 NOTE — Telephone Encounter (Signed)
Patient's mother calling saying [facility] on [address] needs PA for lamoTRIgine (LAMICTAL) 25 MG CHEW chewable tablet.

## 2016-10-10 NOTE — Telephone Encounter (Signed)
Called [employer] to initiate PA. Was told that med is covered under pt's Medicaid but her coverage was recently stopped and she would need to contact her case worker. Returned call to pt's mom. She said that pt's Medicaid did change on her birthday and she has a new ID #. Agreed to have pt or pt's father given new insurance info to pharmacy in order to have med filled. Will call back if additional questions/concerns.

## 2016-11-03 ENCOUNTER — Other Ambulatory Visit: Payer: Self-pay | Admitting: Neurology

## 2016-11-04 ENCOUNTER — Ambulatory Visit: Payer: Medicaid Other | Attending: Family Medicine | Admitting: Speech Pathology

## 2016-11-04 DIAGNOSIS — R4789 Other speech disturbances: Secondary | ICD-10-CM | POA: Insufficient documentation

## 2016-11-04 NOTE — Patient Instructions (Addendum)
.   Tips for Talking . Say one thing at a time . Don't  rush - slow down, be patient . Talk face to face . Reduce background noise . Relax - be natural  Describing words  What group does it belong to?  What do I use it for?  Where can I find it?  What does it LOOK like?  What other words go with it?  What is the 1st sound of the word  Applications: [employer]  [email or web address]  : Find a provider, to search for private speech therapy in the area

## 2016-11-04 NOTE — Therapy (Signed)
[facility] [address] [place], [place], [address] Phone: [phone]   Fax:  [phone]  Speech Language Pathology Evaluation  Patient Details  Name: [place] MRN: [id number] Date of Birth: August 14, 1995 Referring Provider: Barbie Banner, MD  Encounter Date: 11/04/2016      End of Session - 11/04/16 1846    Visit Number 1   Number of Visits 4   Date for SLP Re-Evaluation 01/03/17   SLP Start Time 0847   SLP Stop Time  0935   SLP Time Calculation (min) 48 min   Activity Tolerance Patient tolerated treatment well      Past Medical History:  Diagnosis Date  . Avoidant or restrictive food intake disorder   . Hx of foot surgery 2008   reconstruction- right   . Seizures (HCC)    64 weeks old    Past Surgical History:  Procedure Laterality Date  . FOOT SURGERY Right 2008   reconstruction  . TYMPANOSTOMY TUBE PLACEMENT      There were no vitals filed for this visit.      Subjective Assessment - 11/04/16 0856    Subjective "I have trouble with -pur-pur-purple (pronouncing words)"    Patient is accompained by: Family member   Currently in Pain? No/denies            SLP Evaluation [facility] - 11/04/16 6962      SLP Visit Information   SLP Received On 11/04/16   Referring Provider Barbie Banner, MD   Onset Date 10/21/16   Medical Diagnosis F80.9, R47.9     Subjective   Patient/Family Stated Goal Work on speech     General Information   HPI [place] is a 21 year old female referred for speech-language evaluation. 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    Behavioral/Cognition Alert, pleasant, cooperative   Mobility Status ambulates independently     Prior Functional Status    Cognitive/Linguistic Baseline Baseline deficits   Baseline deficit details stuttering, receptive and expressive language deficits, articulation deficits, cognitive deficits   Type of Home House    Lives With Family   Available Support Family   Education finished high school   Vocation Other (comment)  vocational program     Cognition   Overall Cognitive Status History of cognitive impairments - at baseline   Attention --   Selective Attention Appears intact   Awareness Appears intact   Problem Solving Appears intact     Auditory Comprehension   Overall Auditory Comprehension Appears within functional limits for tasks assessed   Yes/No Questions Within Functional Limits   Commands Within Functional Limits   Conversation Simple     Visual Recognition/Discrimination   Discrimination Exceptions to [facility]   Pictures Able in field of 3     Reading Comprehension   Reading Status Not tested  Read Grandfather passage     Expression   Primary Mode of Expression Verbal     Verbal Expression   Overall Verbal Expression Impaired at baseline   Initiation No impairment   Automatic Speech Name;Social Response   Level of Generative/Spontaneous Verbalization Conversation   Repetition No impairment   Naming Impairment   Confrontation 50-74% accurate   Pragmatics No impairment   Interfering Components Premorbid deficit  Written Expression   Dominant Hand Right   Written Expression --  WFL for tasks assessed (name, address)     Oral Motor/Sensory Function   Overall Oral Motor/Sensory Function Impaired at baseline   Labial ROM Within Functional Limits   Labial Symmetry Within Functional Limits   Labial Strength Within Functional Limits   Labial Sensation Within Functional Limits   Labial Coordination WFL   Lingual ROM Within Functional Limits   Lingual Symmetry Abnormal symmetry left   Lingual Strength Within Functional Limits   Lingual Sensation Within Functional Limits    Lingual Coordination Reduced   Facial ROM Within Functional Limits   Facial Symmetry Within Functional Limits   Facial Strength Within Functional Limits   Facial Sensation Within Functional Limits   Velum Within Functional Limits   Mandible Within Functional Limits     Motor Speech   Overall Motor Speech Impaired at baseline   Respiration Within functional limits   Phonation Normal   Resonance Within functional limits   Articulation Impaired   Level of Impairment Sentence   Intelligibility Intelligibility reduced   Word 75-100% accurate   Phrase 75-100% accurate   Sentence 75-100% accurate   Conversation 75-100% accurate   Motor Planning Witnin functional limits   Motor Speech Errors Aware   Interfering Components Premorbid status   Effective Techniques Slow rate;Over-articulate   Phonation WFL     Standardized Assessments   Standardized Assessments  Other Assessment   Other Assessment Intelligbility in context scale 3.2/5, (5 is highly intelligible, 1 is low intelligibility)                         SLP Education - 11/04/16 1846    Education provided Yes   Education Details reducing time pressure for speech demands   Person(s) Educated Patient;Parent(s)   Methods Explanation;Demonstration   Comprehension Verbalized understanding          SLP Short Term Goals - 11/04/16 1901      SLP SHORT TERM GOAL #1   Title pt will demo use of speech intelligibility strategies over 2 sessions   Time 3   Period Weeks   Status New          SLP Long Term Goals - 11/04/16 1903      SLP LONG TERM GOAL #1   Title pt will demonstrate HEP for speech intelligibility    Time 3   Period Weeks          Plan - 11/04/16 1854    Clinical Impression Statement Patient presents with baseline communication deficits in cognition, expressive and receptive language and articulation secondary to developmental delay. Patient states "Sometimes I don't make sense when I  talk." Patient's father states patient received speech therapy in school; he is concerned she has "flatlined" since she has not been receiving speech services since graduating. Patient and her father express their primary concern is intelligibility and "fluency." Patient is noted to have moderate disfluencies during speech, including prolongations and sound, syllable and whole word repetitions. Overall intelligibility to this trained listener is ~85%.  Via Intelligibility in Context Scale (ICS), Father reports he and immediate family "usually" understand pt, whereas extended family, friends, acquaintances and strangers "sometimes" understand pt. At word, phrase level, pt is highly intelligible, though intelligibility decreases with increased length of utterances/rate of speech. Mild distortions with /r/, blends which do not significantly impact intelligibility. Provided education to father and pt re: strategies to improve intelligibility included  slowed rate of speech, overarticulation, as well as reducing time pressure demands, slowing rate to manage disfluencies. Recommend brief follow-up for 3 sessions to provide training in compensatory strategies to improve intelligibility/functional communication as well assist patient and her family with setting up a home exercise routine to improve carryover/maintence of trained techniques.   Speech Therapy Frequency 1x /week   Duration --  3 additional visits    Treatment/Interventions SLP instruction and feedback;Compensatory strategies;Patient/family education   Potential to Achieve Goals Good   Potential Considerations Previous level of function;Ability to learn/carryover information   SLP Home Exercise Plan Constant Therapy app trial   Consulted and Agree with Plan of Care Patient;Family member/caregiver      Patient will benefit from skilled therapeutic intervention in order to improve the following deficits and impairments:   Other speech disturbance       G-Codes - 11/12/2016 1905    Functional Assessment Tool Used NOMS 5   Functional Limitations Motor speech   Motor Speech Current Status [phone]) At least 60 percent but less than 80 percent impaired, limited or restricted   Motor Speech Goal Status (W0981) At least 60 percent but less than 80 percent impaired, limited or restricted      Problem List Patient Active Problem List   Diagnosis Date Noted  . 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 Developmental delay 09/17/2015  . Convulsion (HCC) 09/17/2015  . Lower extremity weakness 09/19/2013   Rondel Baton, MS CF-SLP Speech-Language Pathologist  Arlana Lindau 12-Nov-2016, 7:07 PM  [facility] [address] [place], [place], [address] Phone: [phone]   Fax:  [phone]  Name: [place] MRN: [id number] Date of Birth: November 12, 1995

## 2016-12-31 ENCOUNTER — Encounter: Payer: Self-pay | Admitting: Neurology

## 2016-12-31 ENCOUNTER — Ambulatory Visit (INDEPENDENT_AMBULATORY_CARE_PROVIDER_SITE_OTHER): Payer: Medicaid Other | Admitting: Neurology

## 2016-12-31 ENCOUNTER — Encounter (INDEPENDENT_AMBULATORY_CARE_PROVIDER_SITE_OTHER): Payer: Self-pay

## 2016-12-31 VITALS — BP 87/61 | HR 79 | Ht 62.0 in | Wt 105.8 lb

## 2016-12-31 DIAGNOSIS — G40209 Localization-related (focal) (partial) symptomatic epilepsy and epileptic syndromes with complex partial seizures, not intractable, without status epilepticus: Secondary | ICD-10-CM

## 2016-12-31 MED ORDER — LAMOTRIGINE 150 MG PO TABS: 150.0000 mg | ORAL_TABLET | Freq: Two times a day (BID) | ORAL | 11 refills | Status: DC

## 2016-12-31 NOTE — Progress Notes (Signed)
[facility]    Provider:  Dr Lucia Gaskins Referring Provider: Barbie Banner, MD Primary Care Physician:  Barbie Banner, MD   CC: Seizure  Interval history 12/31/2016: This is a 21 year old here for follow-up of seizures. 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 Three-day EEG was abnormal with right-sided sharp epileptiform discharges consistent with her left-sided symptoms. She was started on Lamictal with good results. At last appointment we titrated her to Lamictal 125 mg twice daily. She is here with her father who provides most information. She has had 3 incidents of her numbness and felt like she was going to have a seizure but didn't. Had one in January and another in February and one recently that felt like the precursor to her seizures. 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 Numbness in the left hand.  She has a job Psychologist, occupational. Discussed management and decided to increase Lamictal. She has an aid that helps her. She may be working at [employer]. She is no longer at [employer]. She is getting her braces out soon.   Interval History 07/02/2016: She is doing well, no more seizures. They had a wonderful thanksgiving. She has had some numbness and blanking out. She felt a little numbness. She felt like she was going to have a seizure but did not have a seizure. Twice. Fingers are getting cold and the tips get white. Discussed we will increase her Lamictal to 125mg  twice daily. Discussed Raynaud's phenomenon.   Interval history 12/12/2015: 21 y.o. female here as a referral from Dr. Andrey Campanile for seizures. 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 Three-day prolonged EEG was abnormal with right-sided sharp epileptiform discharges seen consistent with her left-sided symptoms. She was started on Lamictal. Feeling better. She has had a few episodes, but not like before. She is in the process of titrating and is on 100mg  in the morning and 75mg  at night. Her blood pressure has been low. She doesn't drink in between meals. She drinks at meals only, maybe only 10oz a day. She is not drinking enough fluids. Friday she was dizzy, she gets light headed and nauseated, like she is going to pass out. Her BP is low today 84/53. They have a hard time getting her to eat and drink. Discussed taking in more fluids and salt. Her mood is better on the Lamictal. She feels happier, mood is better, not tired unless she has a full day. The last time she had an episode was several weeks ago and she was not on the full dose of the medication. No sides effects to the Lamictal.   Interval history 10/26/2015: This is a lovely patient is here with her dad for follow-up after a 3 day ambulatory video EEG that was abnormal. Patient was started on Keppra at last appointment however she is having side effects including behavior changes. Her long discussion about epilepsy with father and patient. We'll start Lamictal. Lamictal is a great medication for both seizures and is also used for mood disorders so does not cause behavioral side effects like the Keppra. We'll continue Keppra while we titrating up  on Lamictal.  PHYSICAN EEG CONCLUSION/IMPRESSION: This was an abnormal prolonged ambulatory 72-hour video EEG. Sharp epileptiform discharges seen in the right hemisphere -right fronto-temporal (F8/T4/T6), temporal (T4/T6 regions). These discharges were present while awake but became more frequent during sleep. Also seen during sleep were isolated bursts of sharp (as well as spike and wave),  generalized, frontally-predominant discharges. These discharges were fairly frequent. No electrographic or electroclinical events present. There was no focal or background slowing seen.  There were 4 push button events. These did not correlate with any changes on the EEG.  Owing to this prolonged VEEG being abnormal with focal abnormalities (right hemispheric discharges), if not recently obtained, a brain MRI and optimization of the anticonvulsant medication is recommended.  HPI: [place] is a 21 y.o. female here as a referral from Dr. Andrey Campanile for seizures. Has a history of developmental delay and seizures, she was not on medication when see in the ED 09/08/2015 for gtcs. Never been on AEDs. Parents here and provide most information. She has anorexia. BP in the Ed was 86/69. She also has reported staring spells. She had her first seizure as an infant at 6 weeks. Then not again until 2 years ago when she had an episode of numbness on the left side and inability to speak. She had another one a year ago and then recently. Last year she passed out eating lunch, they were told she had a seizure and her blood glucose was 40 and described as shaking on the floor. Teacher said this was def a seizure in March or April. 2 years ago it happened at the table c/o numbness on the left side face and arm, speech become impaired then it passes but she is aware and responsive and interactive and these happen sporadically once in a month. Last Friday she was getting ready for work, dad heard a fall and vocalizations, she was twitching and on her side and had vomited, she was not responsive, no tongue biting, she was breathing, she was trying to speak but couldn't and trying to get up. Eyes were closed and fluttering a little, she heard his voice. She was sitting up in a few minutes and disoriented.She remembers eating and she noticed numbness in the left hand and left face and remembers falling and hitting the  floor but she is not sure the next thing she remembers is the paramedics around her. They started her on Keppra and sh eis a litle sleepy. She graduated high school. She is taking some online classes. She is seeing a therapist and has been told these episodes are due to stress.   Reviewed notes, labs and imaging from outside physicians, which showed:  Patient seen in the ED 09/08/2015. Per notes presenting to the ED with a grand mal seizure that occurred just PTA. Witnessed by patient's father, now at the bedside. Father states that the patient was sitting on a chair in the kitchen, fell to the floor, and began having full body convulsions. Positive for urinary incontinence and vomiting. Patient also complains of abdominal pain, which she indicates is chronic. Patient states that she hurts "a little bit." Pain is nonradiating and is accompanied by nausea. Pt has a history of seizures, but has not had one for over a year, and is not on medication for them. Pt and patient's parents confirm that the patient has never been on seizure medication before. Parents add that the patient has been evaluated over the past few months for decreased appetite,  was found to have low estrogen and had not had a period for two years due to anorexia. Put on OCPs and increased diet, period returned. EMS reports pt was CAOx4 upon their arrival with no seizure activity noted. Patient's parents at the bedside confirm pt is behaving normally. Pt is currently menstruating. Patient's parents also state that the patient has had intermittent "episodes" that include the patient's face unilaterally becoming numb, followed by a period of 30 seconds to 1 minute where the patient stares straight ahead and is not arousable.Her BP was 86/69 but she was asymptomatic.   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  Smaller areas of chronic infarction in the occipital lobes bilaterally. No acute abnormality.  IMPRESSION: No acute abnormality head, face or cervical spine. Remote bilateral parietal and occipital lobe infarcts.  Seen in the ED 2/3. Urine rapid drug screen negative, pregnancy test negative, CMP and CBC normal, EtOH negative.   Her emergency room notes on 09/08/2015, patient presented to the ED with reports of generalized tonic-clonic seizure witnessed by patient's father. Father states that the patient was sitting on a chair in the kitchen and fell to the floor and began having full body convulsions. Positive for urinary incontinence and vomiting. EMS reported patient was alert and oriented 4 upon their arrival with no seizure activity noted. Patient was at baseline in the emergency room. He spoke with Dr. Amada JupiterKirkpatrick who started the patient on 500 mg twice a day of Keppra with outpatient follow-up in neurology.   Review of Systems: Patient complains of symptoms per HPI as well as the following symptoms: no rash, no CP, no SOB, no fatigue. Pertinent negatives and positives per HPI. All others negative.   Social History   Social History  . Marital status: Single    Spouse name: N/A  . Number of children: 0  . Years of education: 12   Occupational History  . [employer]    Social History Main Topics  . Smoking status: Never Smoker  . Smokeless tobacco: Never Used  . Alcohol use No  . Drug use: No  . Sexual activity: Not on file   Other Topics Concern  . Not on file   Social History Narrative   Lives with parents   Caffeine use: daily iced tea    Family History  Problem Relation Age of Onset  . Diabetes Mother   . Breast cancer Maternal Grandmother   . Breast cancer Paternal Grandmother   . Heart disease Maternal Grandfather   . Seizures Neg Hx   . Colon cancer Neg Hx   . Stomach cancer Neg Hx   . Esophageal cancer Neg Hx   . Pancreatic cancer Neg Hx   . Liver  disease Neg Hx     Past Medical History:  Diagnosis Date  . Avoidant or restrictive food intake disorder   . Hx of foot surgery 2008   reconstruction- right   . Seizures (HCC)    475 weeks old    Past Surgical History:  Procedure Laterality Date  . FOOT SURGERY Right 2008   reconstruction  . TYMPANOSTOMY TUBE PLACEMENT      Current Outpatient Prescriptions  Medication Sig Dispense Refill  . lamoTRIgine (LAMICTAL) 25 MG CHEW chewable tablet Chew 5 tablets (125 mg total) by mouth 2 (two) times daily. 300 tablet 11  . Norgestim-Eth Estrad Triphasic (TRI-SPRINTEC PO) Take 1  tablet by mouth daily with supper.     Current Facility-Administered Medications  Medication Dose Route Frequency Provider Last Rate Last Dose  . 0.9 %  sodium chloride infusion  500 mL Intravenous Continuous Hilarie Fredrickson, MD        Allergies as of 12/31/2016  . (No Known Allergies)    Vitals: There were no vitals taken for this visit. Last Weight:  Wt Readings from Last 1 Encounters:  07/02/16 101 lb 6.4 oz (46 kg)   Last Height:   Ht Readings from Last 1 Encounters:  07/02/16 5\' 2"  (1.575 m)   Physical exam: Exam: Gen: NAD, conversant, well nourised, obese, well groomed                     Eyes: Conjunctivae clear without exudates or hemorrhage  Neuro: Detailed Neurologic Exam  Speech:    Speech is normal; fluent and spontaneous with normal comprehension.  Cognition:    The patient is oriented to person, place, and time;     recent and remote memory intact;     language fluent;     normal attention, concentration;    fund of knowledge impaired Cranial Nerves:    The pupils are equal, round, and reactive to light. Visual fields are full to finger confrontation. Extraocular movements are intact. Trigeminal sensation is intact and the muscles of mastication are normal. The face is symmetric. The palate elevates in the midline. Hearing intact. Voice is normal. Shoulder shrug is normal. The  tongue has normal motion without fasciculations.   Motor Observation:    no involuntary movements noted. Tone:    Normal muscle tone.      Strength:    Strength is V/V in the upper and lower limbs.        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 MRI of the brain showed chronic hemorrhagic infarctions in the parietal lobes bilaterally left greater than right and a smaller area of chronic infarction in the occipital lobes bilaterally. Three-day EEG showed right-sided sharp epileptiform discharges consistent with her left-sided symptoms.  Increase Lamictal 150 mg twice daily Do not drive unless seizure free for at least 6 months. Discussed seizure precautions. Call EMS with any other seizure-like activity. We did discuss SUDEP again.   Naomie Dean, MD  [facility] [address] [place], [place] [address]  Phone [phone] Fax [phone]  A total of 15 minutes was spent face-to-face with this patient. Over half this time was spent on counseling patient on the seizure diagnosis and different therapeutic options available.

## 2016-12-31 NOTE — Patient Instructions (Addendum)
Remember to drink plenty of fluid, eat healthy meals and do not skip any meals. Try to eat protein with a every meal and eat a healthy snack such as fruit or nuts in between meals. Try to keep a regular sleep-wake schedule and try to exercise daily, particularly in the form of walking, 20-30 minutes a day, if you can.   As far as your medications are concerned, I would like to suggest: Increasre Lamictal to 150mg  twice a day  I would like to see you back in 6 months, sooner if we need to. Please call us with any interim questions, concerns, problems, updates or refill requests.   Our phone number is [phone]. We also have an after hours call service for urgent matters and there is a physician on-call for urgent questions. For any emergencies you know to call 911 or go to the nearest emergency room  Lamotrigine tablets What is this medicine? LAMOTRIGINE (la MOE Patrecia Pacetri jeen) is used to control seizures in adults and children with epilepsy and Lennox-Gastaut syndrome. It is also used in adults to treat bipolar disorder. This medicine may be used for other purposes; ask your health care provider or pharmacist if you have questions. COMMON BRAND NAME(S): Lamictal What should I tell my health care provider before I take this medicine? They need to know if you have any of these conditions: -a history of depression or bipolar disorder -aseptic meningitis during prior use of lamotrigine -folate deficiency -kidney disease -liver disease -suicidal thoughts, plans, or attempt; a previous suicide attempt by you or a family member -an unusual or allergic reaction to lamotrigine or other seizure medications, other medicines, foods, dyes, or preservatives -pregnant or trying to get pregnant -breast-feeding How should I use this medicine? Take this medicine by mouth with a glass of water. Follow the directions on the prescription label. Do not chew these tablets. If this medicine upsets your stomach, take  it with food or milk. Take your doses at regular intervals. Do not take your medicine more often than directed. A special MedGuide will be given to you by the pharmacist with each new prescription and refill. Be sure to read this information carefully each time. Talk to your pediatrician regarding the use of this medicine in children. While this drug may be prescribed for children as young as 2 years for selected conditions, precautions do apply. Overdosage: If you think you have taken too much of this medicine contact a poison control center or emergency room at once. NOTE: This medicine is only for you. Do not share this medicine with others. What if I miss a dose? If you miss a dose, take it as soon as you can. If it is almost time for your next dose, take only that dose. Do not take double or extra doses. What may interact with this medicine? -carbamazepine -female hormones, including contraceptive or birth control pills -methotrexate -phenobarbital -phenytoin -primidone -pyrimethamine -rifampin -trimethoprim -valproic acid This list may not describe all possible interactions. Give your health care provider a list of all the medicines, herbs, non-prescription drugs, or dietary supplements you use. Also tell them if you smoke, drink alcohol, or use illegal drugs. Some items may interact with your medicine. What should I watch for while using this medicine? Visit your doctor or health care professional for regular checks on your progress. If you take this medicine for seizures, wear a Medic Alert bracelet or necklace. Carry an identification card with information about your condition, medicines, and doctor  or health care professional. It is important to take this medicine exactly as directed. When first starting treatment, your dose will need to be adjusted slowly. It may take weeks or months before your dose is stable. You should contact your doctor or health care professional if your  seizures get worse or if you have any new types of seizures. Do not stop taking this medicine unless instructed by your doctor or health care professional. Stopping your medicine suddenly can increase your seizures or their severity. Contact your doctor or health care professional right away if you develop a rash while taking this medicine. Rashes may be very severe and sometimes require treatment in the hospital. Deaths from rashes have occurred. Serious rashes occur more often in children than adults taking this medicine. It is more common for these serious rashes to occur during the first 2 months of treatment, but a rash can occur at any time. You may get drowsy, dizzy, or have blurred vision. Do not drive, use machinery, or do anything that needs mental alertness until you know how this medicine affects you. To reduce dizzy or fainting spells, do not sit or stand up quickly, especially if you are an older patient. Alcohol can increase drowsiness and dizziness. Avoid alcoholic drinks. If you are taking this medicine for bipolar disorder, it is important to report any changes in your mood to your doctor or health care professional. If your condition gets worse, you get mentally depressed, feel very hyperactive or manic, have difficulty sleeping, or have thoughts of hurting yourself or committing suicide, you need to get help from your health care professional right away. If you are a caregiver for someone taking this medicine for bipolar disorder, you should also report these behavioral changes right away. The use of this medicine may increase the chance of suicidal thoughts or actions. Pay special attention to how you are responding while on this medicine. Your mouth may get dry. Chewing sugarless gum or sucking hard candy, and drinking plenty of water may help. Contact your doctor if the problem does not go away or is severe. Women who become pregnant while using this medicine may enroll in the [place]  American Antiepileptic Drug Pregnancy Registry by calling 323-210-1479. This registry collects information about the safety of antiepileptic drug use during pregnancy. What side effects may I notice from receiving this medicine? Side effects that you should report to your doctor or health care professional as soon as possible: -allergic reactions like skin rash, itching or hives, swelling of the face, lips, or tongue -blurred or double vision -difficulty walking or controlling muscle movements -fever -headache, stiff neck, and sensitivity to light -painful sores in the mouth, eyes, or nose -redness, blistering, peeling or loosening of the skin, including inside the mouth -severe muscle pain -swollen lymph glands -uncontrollable eye movements -unusual bruising or bleeding -unusually weak or tired -vomiting -worsening of mood, thoughts or actions of suicide or dying -yellowing of the eyes or skin Side effects that usually do not require medical attention (report to your doctor or health care professional if they continue or are bothersome): -diarrhea or constipation -difficulty sleeping -nausea -tremors This list may not describe all possible side effects. Call your doctor for medical advice about side effects. You may report side effects to FDA at 1-800-FDA-1088. Where should I keep my medicine? Keep out of reach of children. Store at room temperature between 15 and 30 degrees C (59 and 86 degrees F). Throw away any unused medicine after  the expiration date. NOTE: This sheet is a summary. It may not cover all possible information. If you have questions about this medicine, talk to your doctor, pharmacist, or health care provider.  2018 Elsevier/Gold Standard (2015-08-24 09:29:40)

## 2017-01-03 ENCOUNTER — Encounter: Payer: Self-pay | Admitting: Neurology

## 2017-01-06 ENCOUNTER — Encounter: Payer: Self-pay | Admitting: Neurology

## 2017-01-27 ENCOUNTER — Encounter: Payer: Self-pay | Admitting: Neurology

## 2017-01-30 ENCOUNTER — Encounter: Payer: Self-pay | Admitting: Neurology

## 2017-02-11 ENCOUNTER — Other Ambulatory Visit: Payer: Self-pay | Admitting: Neurology

## 2017-02-11 MED ORDER — LAMOTRIGINE 200 MG PO TABS: 200.0000 mg | ORAL_TABLET | Freq: Two times a day (BID) | ORAL | 12 refills | Status: DC

## 2017-03-11 ENCOUNTER — Emergency Department (HOSPITAL_BASED_OUTPATIENT_CLINIC_OR_DEPARTMENT_OTHER)
Admission: EM | Admit: 2017-03-11 | Discharge: 2017-03-11 | Disposition: A | Discharge status: Home / Self Care | Payer: Medicaid Other | Attending: Emergency Medicine | Admitting: Emergency Medicine

## 2017-03-11 ENCOUNTER — Encounter (HOSPITAL_BASED_OUTPATIENT_CLINIC_OR_DEPARTMENT_OTHER): Payer: Self-pay | Admitting: Emergency Medicine

## 2017-03-11 DIAGNOSIS — Y929 Unspecified place or not applicable: Secondary | ICD-10-CM | POA: Insufficient documentation

## 2017-03-11 DIAGNOSIS — H53142 Visual discomfort, left eye: Secondary | ICD-10-CM | POA: Diagnosis not present

## 2017-03-11 DIAGNOSIS — X158XXA Contact with other hot household appliances, initial encounter: Secondary | ICD-10-CM | POA: Insufficient documentation

## 2017-03-11 DIAGNOSIS — Y93E8 Activity, other personal hygiene: Secondary | ICD-10-CM | POA: Insufficient documentation

## 2017-03-11 DIAGNOSIS — Z79899 Other long term (current) drug therapy: Secondary | ICD-10-CM | POA: Insufficient documentation

## 2017-03-11 DIAGNOSIS — W92XXXA Exposure to excessive heat of man-made origin, initial encounter: Secondary | ICD-10-CM | POA: Diagnosis not present

## 2017-03-11 DIAGNOSIS — Y999 Unspecified external cause status: Secondary | ICD-10-CM | POA: Diagnosis not present

## 2017-03-11 DIAGNOSIS — T2612XA Burn of cornea and conjunctival sac, left eye, initial encounter: Secondary | ICD-10-CM | POA: Diagnosis not present

## 2017-03-11 DIAGNOSIS — S0502XA Injury of conjunctiva and corneal abrasion without foreign body, left eye, initial encounter: Secondary | ICD-10-CM | POA: Diagnosis present

## 2017-03-11 MED ORDER — ERYTHROMYCIN 5 MG/GM OP OINT: TOPICAL_OINTMENT | OPHTHALMIC | 0 refills | Status: DC

## 2017-03-11 MED ORDER — TETRACAINE HCL 0.5 % OP SOLN
2.0000 [drp] | Freq: Once | OPHTHALMIC | Status: AC
  Administered 2017-03-11: 2 [drp] via OPHTHALMIC
  Filled 2017-03-11: qty 4

## 2017-03-11 MED ORDER — FLUORESCEIN SODIUM 0.6 MG OP STRP
1.0000 | ORAL_STRIP | Freq: Once | OPHTHALMIC | Status: AC
  Administered 2017-03-11: 1 via OPHTHALMIC
  Filled 2017-03-11: qty 1

## 2017-03-11 NOTE — ED Notes (Signed)
ED Provider at bedside. 
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
## 2017-04-17 ENCOUNTER — Encounter: Payer: Self-pay | Admitting: Speech Pathology

## 2017-04-17 NOTE — Therapy (Signed)
SPEECH THERAPY DISCHARGE SUMMARY  Visits from Start of Care: 1  Current functional level related to goals / functional outcomes: See evaluation for details.    Remaining deficits: See evaluation for details.   Education / Equipment: none  Plan: Patient agrees to discharge.  Patient goals were not met. Patient is being discharged due to financial reasons.  ?????       SLP Short Term Goals - 11/04/16 1901      SLP SHORT TERM GOAL #1   Title pt will demo use of speech intelligibility strategies over 2 sessions   Baseline Pt's speech intelligibility is 85% for trained, unfamiliar listener.   Time 3   Period Weeks   Status New           SLP Long Term Goals - 11/04/16 1903      SLP LONG TERM GOAL #1   Title pt will demonstrate HEP for speech intelligibility    Baseline Pt's speech intelligibility is 85% for trained, unfamiliar listener.   [facility] [address] [place], [place], [address] Phone: [phone]   Fax:  [phone]  Patient Details  Name: Martha Wells MRN: [id number] Date of Birth: 1996-05-10 Referring Provider:  No ref. provider found  Encounter Date: 04/17/2017  Deneise Lever, [place], [place] 04/17/2017, 9:47 AM  [facility] [address] [place], [place], [address] Phone: [phone]   Fax:  [phone]

## 2017-07-01 ENCOUNTER — Ambulatory Visit: Payer: Medicaid Other | Admitting: Neurology

## 2017-12-09 ENCOUNTER — Ambulatory Visit: Payer: Self-pay | Admitting: Neurology

## 2018-01-07 ENCOUNTER — Encounter (INDEPENDENT_AMBULATORY_CARE_PROVIDER_SITE_OTHER): Payer: Self-pay

## 2018-01-08 ENCOUNTER — Ambulatory Visit: Payer: Medicaid Other | Admitting: Neurology

## 2018-01-08 ENCOUNTER — Encounter: Payer: Self-pay | Admitting: Neurology

## 2018-01-08 VITALS — BP 98/70 | HR 92 | Ht 62.0 in | Wt 108.0 lb

## 2018-01-08 DIAGNOSIS — G40009 Localization-related (focal) (partial) idiopathic epilepsy and epileptic syndromes with seizures of localized onset, not intractable, without status epilepticus: Secondary | ICD-10-CM

## 2018-01-08 DIAGNOSIS — Z79899 Other long term (current) drug therapy: Secondary | ICD-10-CM | POA: Diagnosis not present

## 2018-01-08 MED ORDER — LAMOTRIGINE ER 200 MG PO TB24: 200.0000 mg | ORAL_TABLET | Freq: Two times a day (BID) | ORAL | 11 refills | Status: DC

## 2018-01-08 NOTE — Patient Instructions (Signed)
Appears to have auras episodes before next Lamotrigine is due, will switch to XR formulation and check trough level in 2 weeks  Lamotrigine extended-release tablets What is this medicine? LAMOTRIGINE (la MOE Patrecia Pace) is used to control seizures in adults and children with epilepsy. This medicine may be used for other purposes; ask your health care provider or pharmacist if you have questions. COMMON BRAND NAME(S): Lamictal XR What should I tell my health care provider before I take this medicine? They need to know if you have any of these conditions: -a history of depression or bipolar disorder -aseptic meningitis during prior use of lamotrigine -folate deficiency -kidney disease -liver disease -suicidal thoughts, plans, or attempt; a previous suicide attempt by you or a family member -an unusual or allergic reaction to lamotrigine or other seizure medicines, other medicines, foods, dyes, or preservatives -pregnant or trying to get pregnant -breast-feeding How should I use this medicine? Take this medicine by mouth with a glass of water. Follow the directions on the prescription label. Swallow these tablets whole. Do not chew, crush, or divide them. If this medicine upsets your stomach, take it with food or milk. Take your doses at regular intervals. Do not take your medicine more often than directed. A special MedGuide will be given to you by the pharmacist with each new prescription and refill. Be sure to read this information carefully each time. Talk to your pediatrician regarding the use of this medicine in children. While this drug may be prescribed for children as young as 57 years of age, precautions do apply. Overdosage: If you think you have taken too much of this medicine contact a poison control center or emergency room at once. NOTE: This medicine is only for you. Do not share this medicine with others. What if I miss a dose? If you miss a dose, take it as soon as you can. If  it is almost time for your next dose, take only that dose. Do not take double or extra doses. What may interact with this medicine? -carbamazepine -female hormones, including contraceptive or birth control pills -methotrexate -phenobarbital -phenytoin -primidone -pyrimethamine -rifampin -trimethoprim -valproic acid This list may not describe all possible interactions. Give your health care provider a list of all the medicines, herbs, non-prescription drugs, or dietary supplements you use. Also tell them if you smoke, drink alcohol, or use illegal drugs. Some items may interact with your medicine. What should I watch for while using this medicine? Visit your doctor or health care professional for regular checks on your progress. Wear a Arboriculturist or necklace. Carry an identification card with information about your condition, medicines, and doctor or health care professional. It is important to take this medicine exactly as directed. When first starting treatment, your dose will need to be adjusted slowly. It may take weeks or months before your dose is stable. You should contact your doctor or health care professional if your seizures get worse or if you have any new types of seizures. Do not stop taking this medicine unless instructed by your doctor or health care professional. Stopping your medicine suddenly can increase your seizures or their severity. Contact your doctor or health care professional right away if you develop a rash while taking this medicine. Rashes may be very severe and sometimes require treatment in the hospital. Deaths from rashes have occurred. Serious rashes occur more often in children than adults taking this medicine. It is more common for these serious rashes to occur during  the first 2 months of treatment, but a rash can occur at any time. You may get drowsy, dizzy, or have blurred vision. Do not drive, use machinery, or do anything that needs mental alertness  until you know how this medicine affects you. To reduce dizzy or fainting spells, do not sit or stand up quickly, especially if you are an older patient. Alcohol can increase drowsiness and dizziness. Avoid alcoholic drinks. The use of this medicine may increase the chance of suicidal thoughts or actions. Pay special attention to how you are responding while on this medicine. Any worsening of mood, or thoughts of suicide or dying should be reported to your health care professional right away. Your mouth may get dry. Chewing sugarless gum or sucking hard candy, and drinking plenty of water may help. Contact your doctor if the problem does not go away or is severe. Women who become pregnant while using this medicine may enroll in the [place] Antiepileptic Drug Pregnancy Registry by calling [phone]. This registry collects information about the safety of antiepileptic drug use during pregnancy. What side effects may I notice from receiving this medicine? Side effects that you should report to your doctor or health care professional as soon as possible: -allergic reactions like skin rash, itching or hives, swelling of the face, lips, or tongue -blurred or double vision -difficulty walking or controlling muscle movements -fever -headache, stiff neck, and sensitivity to light -painful sores in the mouth, eyes, or nose -redness, blistering, peeling or loosening of the skin, including inside the mouth -severe muscle pain -swollen lymph glands -uncontrollable eye movements -unusual bruising or bleeding -unusually weak or tired -vomiting -worsening of mood, thoughts or actions of suicide or dying -yellowing of the eyes or skin Side effects that usually do not require medical attention (report to your doctor or health care professional if they continue or are bothersome): -diarrhea, or constipation -difficulty sleeping -nausea -tremors This list may not describe all possible side effects.  Call your doctor for medical advice about side effects. You may report side effects to FDA at 1-800-FDA-1088. Where should I keep my medicine? Keep out of reach of children. Store at room temperature between 15 and 30 degrees C (59 and 86 degrees F). Throw away any unused medicine after the expiration date. NOTE: This sheet is a summary. It may not cover all possible information. If you have questions about this medicine, talk to your doctor, pharmacist, or health care provider.  2018 Elsevier/Gold Standard (2015-08-24 09:33:00)

## 2018-01-08 NOTE — Progress Notes (Signed)
[facility]    Provider:  Dr Lucia Gaskins Referring Provider: Barbie Banner, MD Primary Care Physician:  Barbie Banner, MD   CC: Seizure  Interval history: Doing well. She has had her aura numbness in the face and arm. This precedes a seizure. She has had several of these episodes. Very stereotyped. Often when eating. They are sporadic and have not progressed to a seizure. Getting less as Lamictal. Will get a Lamictal level but she already took the pill so will need to come back for a trough level. Appears to have these episodes before next Lamotrigine is due, will switch to XR formulation and check trough level in 2 weeks  Interval history 12/31/2016: This is a 22 year old here for follow-up of seizures. 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 Three-day EEG was abnormal with right-sided sharp epileptiform discharges consistent with her left-sided symptoms. She was started on Lamictal with good results. At last appointment we titrated her to Lamictal 125 mg twice daily. She is here with her father who provides most information. She has had 3 incidents of her numbness and felt like she was going to have a seizure but didn't. Had one in January and another in February and one recently that felt like the precursor to her seizures. 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 Numbness in the left hand.  She has a job Psychologist, occupational. Discussed management and decided to increase Lamictal. She has an aid that helps her. She may be working at [employer]. She is no longer at [employer]. She is getting her braces out soon.   Interval History 07/02/2016: She is doing well, no more seizures. They had a wonderful thanksgiving. She has had some numbness and blanking out. She felt a  little numbness. She felt like she was going to have a seizure but did not have a seizure. Twice. Fingers are getting cold and the tips get white. Discussed we will increase her Lamictal to 125mg  twice daily. Discussed Raynaud's phenomenon.   Interval history 12/12/2015: 22 y.o. female here as a referral from Dr. Andrey Campanile for seizures. 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 Three-day prolonged EEG was abnormal with right-sided sharp epileptiform discharges seen consistent with her left-sided symptoms. She was started on Lamictal. Feeling better. She has had a few episodes, but not like before. She is in the process of titrating and is on 100mg  in the morning and 75mg  at night. Her blood pressure has been low. She doesn't drink in between meals. She drinks at meals only, maybe only 10oz a day. She is not drinking enough fluids. Friday she was dizzy, she gets light headed and nauseated, like she is going to pass out. Her BP is low today 84/53. They have a hard time getting her to eat and drink. Discussed taking in more fluids and salt. Her mood is better on the Lamictal. She feels happier, mood is better, not tired unless she has a full day. The last time she had an episode was several weeks ago and she was not on the full dose of the medication. No sides effects to the  Lamictal.   Interval history 10/26/2015: This is a lovely patient is here with her dad for follow-up after a 3 day ambulatory video EEG that was abnormal. Patient was started on Keppra at last appointment however she is having side effects including behavior changes. Her long discussion about epilepsy with father and patient. We'll start Lamictal. Lamictal is a great medication for both seizures and is also used for mood disorders so  does not cause behavioral side effects like the Keppra. We'll continue Keppra while we titrating up on Lamictal.  PHYSICAN EEG CONCLUSION/IMPRESSION: This was an abnormal prolonged ambulatory 72-hour video EEG. Sharp epileptiform discharges seen in the right hemisphere -right fronto-temporal (F8/T4/T6), temporal (T4/T6 regions). These discharges were present while awake but became more frequent during sleep. Also seen during sleep were isolated bursts of sharp (as well as spike and wave), generalized, frontally-predominant discharges. These discharges were fairly frequent. No electrographic or electroclinical events present. There was no focal or background slowing seen.  There were 4 push button events. These did not correlate with any changes on the EEG.  Owing to this prolonged VEEG being abnormal with focal abnormalities (right hemispheric discharges), if not recently obtained, a brain MRI and optimization of the anticonvulsant medication is recommended.  HPI: Martha Wells is a 22 y.o. female here as a referral from Dr. Andrey CampanileWilson for seizures. Has a history of developmental delay and seizures, she was not on medication when see in the ED 09/08/2015 for gtcs. Never been on AEDs. Parents here and provide most information. She has anorexia. BP in the Ed was 86/69. She also has reported staring spells. She had her first seizure as an infant at 6 weeks. Then not again until 2 years ago when she had an episode of numbness on the left side and inability to speak. She had another one a year ago and then recently. Last year she passed out eating lunch, they were told she had a seizure and her blood glucose was 40 and described as shaking on the floor. Teacher said this was def a seizure in March or April. 2 years ago it happened at the table c/o numbness on the left side face and arm, speech become impaired then it passes but she is aware and responsive and interactive and these happen sporadically once  in a month. Last Friday she was getting ready for work, dad heard a fall and vocalizations, she was twitching and on her side and had vomited, she was not responsive, no tongue biting, she was breathing, she was trying to speak but couldn't and trying to get up. Eyes were closed and fluttering a little, she heard his voice. She was sitting up in a few minutes and disoriented.She remembers eating and she noticed numbness in the left hand and left face and remembers falling and hitting the floor but she is not sure the next thing she remembers is the paramedics around her. They started her on Keppra and sh eis a litle sleepy. She graduated high school. She is taking some online classes. She is seeing a therapist and has been told these episodes are due to stress.   Reviewed notes, labs and imaging from outside physicians, which showed:  Patient seen in the ED 09/08/2015. Per notes presenting to the ED with a grand mal seizure that occurred just PTA. Witnessed by patient's father, now at the bedside. Father states that the patient was sitting on a chair in the kitchen, fell to the floor, and began having  full body convulsions. Positive for urinary incontinence and vomiting. Patient also complains of abdominal pain, which she indicates is chronic. Patient states that she hurts "a little bit." Pain is nonradiating and is accompanied by nausea. Pt has a history of seizures, but has not had one for over a year, and is not on medication for them. Pt and patient's parents confirm that the patient has never been on seizure medication before. 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 Put on OCPs and increased diet, period returned. EMS reports pt was CAOx4 upon their arrival with no seizure activity noted. Patient's parents at the bedside confirm pt is behaving normally. Pt is  currently menstruating. Patient's parents also state that the patient has had intermittent "episodes" that include the patient's face unilaterally becoming numb, followed by a period of 30 seconds to 1 minute where the patient stares straight ahead and is not arousable.Her BP was 86/69 but she was asymptomatic.   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 Smaller areas of chronic infarction in the occipital lobes bilaterally. No acute abnormality.  IMPRESSION: No acute abnormality head, face or cervical spine. Remote bilateral parietal and occipital lobe infarcts.  Seen in the ED 2/3. Urine rapid drug screen negative, pregnancy test negative, CMP and CBC normal, EtOH negative.   Her emergency room notes on 09/08/2015, patient presented to the ED with reports of generalized tonic-clonic seizure witnessed by patient's father. Father states that the patient was sitting on a chair in the kitchen and fell to the floor and began having full body convulsions. Positive for urinary incontinence and vomiting. EMS reported patient was alert and oriented 4 upon their arrival with no seizure activity noted. Patient was at baseline in the emergency room. He spoke with Dr. Amada Jupiter who started the patient on 500 mg twice a day of Keppra with outpatient follow-up in neurology.   Review of Systems: Patient complains of symptoms per HPI as well as the following symptoms: no rash, no CP, no SOB, no fatigue. Pertinent negatives and positives per HPI. All others negative.   Social History   Socioeconomic History  . Marital status: Single    Spouse name: Not on file  . Number of children: 0  . Years of education: 45  . Highest education level: Not on file  Occupational History  . Occupation: [employer]  Social Needs  . Financial resource strain: Not on file  . Food  insecurity:    Worry: Not on file    Inability: Not on file  . Transportation needs:    Medical: Not on file    Non-medical: Not on file  Tobacco Use  . Smoking status: Never Smoker  . Smokeless tobacco: Never Used  Substance and Sexual Activity  . Alcohol use: No  . Drug use: No  . Sexual activity: Not on file  Lifestyle  . Physical activity:    Days per week: Not on file    Minutes per session: Not on file  . Stress: Not on file  Relationships  . Social connections:    Talks on phone: Not on file    Gets together: Not on file    Attends religious service: Not on file    Active member of  club or organization: Not on file    Attends meetings of clubs or organizations: Not on file    Relationship status: Not on file  . Intimate partner violence:    Fear of current or ex partner: Not on file    Emotionally abused: Not on file    Physically abused: Not on file    Forced sexual activity: Not on file  Other Topics Concern  . Not on file  Social History Narrative   Lives with parents   Caffeine use: daily iced tea, mostly water   Right handed    Family History  Problem Relation Age of Onset  . Diabetes Mother   . Breast cancer Maternal Grandmother   . Breast cancer Paternal Grandmother   . Heart disease Maternal Grandfather   . Seizures Neg Hx   . Colon cancer Neg Hx   . Stomach cancer Neg Hx   . Esophageal cancer Neg Hx   . Pancreatic cancer Neg Hx   . Liver disease Neg Hx     Past Medical History:  Diagnosis Date  . Avoidant or restrictive food intake disorder   . Hx of foot surgery 2008   reconstruction- right   . Seizures (HCC)    37 weeks old    Past Surgical History:  Procedure Laterality Date  . FOOT SURGERY Right 2008   reconstruction  . TYMPANOSTOMY TUBE PLACEMENT      Current Outpatient Medications  Medication Sig Dispense Refill  . lamoTRIgine (LAMICTAL) 200 MG tablet Take 1 tablet (200 mg total) by mouth 2 (two) times daily. 60 tablet 12  .  Norgestim-Eth Estrad Triphasic (TRI-SPRINTEC PO) Take 1 tablet by mouth daily with supper.    . esomeprazole (NEXIUM) 40 MG capsule Take one capsule daily to reduce stomach acid.     Current Facility-Administered Medications  Medication Dose Route Frequency Provider Last Rate Last Dose  . 0.9 %  sodium chloride infusion  500 mL Intravenous Continuous Hilarie Fredrickson, MD        Allergies as of 01/08/2018  . (No Known Allergies)    Vitals: BP 98/70 (BP Location: Right Arm, Patient Position: Sitting)   Pulse 92   Ht 5\' 2"  (1.575 m)   Wt 108 lb (49 kg)   BMI 19.75 kg/m  Last Weight:  Wt Readings from Last 1 Encounters:  01/08/18 108 lb (49 kg)   Last Height:   Ht Readings from Last 1 Encounters:  01/08/18 5\' 2"  (1.575 m)   Physical exam: Exam: Gen: NAD, conversant, well nourised, obese, well groomed                     Eyes: Conjunctivae clear without exudates or hemorrhage  Neuro: Detailed Neurologic Exam  Speech:    Speech is normal; fluent and spontaneous with normal comprehension.  Cognition:    The patient is oriented to person, place, and time;     recent and remote memory intact;     language fluent;     normal attention, concentration;    fund of knowledge impaired Cranial Nerves:    The pupils are equal, round, and reactive to light. Visual fields are full to finger confrontation. Extraocular movements are intact. Trigeminal sensation is intact and the muscles of mastication are normal. The face is symmetric. The palate elevates in the midline. Hearing intact. Voice is normal. Shoulder shrug is normal. The tongue has normal motion without fasciculations.   Motor Observation:  no involuntary movements noted. Tone:    Normal muscle tone.      Strength:    Strength is V/V in the upper and lower limbs.        Assessment/Plan:  This is a lovely 22 year old female here for follow-up of seizures with a past medical history of developmental delay and seizures  secondary to in utero strokes. MRI of the brain showed chronic hemorrhagic infarctions in the parietal lobes bilaterally left greater than right and a smaller area of chronic infarction in the occipital lobes bilaterally. Three-day EEG showed right-sided sharp epileptiform discharges consistent with her left-sided symptoms.  Appears to have seizure aura episodes before next Lamotrigine is due, will switch to XR formulation and check trough level in 2 weeks. Check a trough in 2 weeks.   Do not drive unless seizure free for at least 6 months. Discussed seizure precautions. Call EMS with any other seizure-like activity. We did discuss SUDEP again.   Naomie Dean, MD  [facility] [address] [place], [place] [address]  Phone [phone] Fax [phone]  A total of 25 minutes was spent face-to-face with this patient. Over half this time was spent on counseling patient on the partial seizure and seizure aura diagnosis and different therapeutic options available.

## 2018-01-29 ENCOUNTER — Other Ambulatory Visit (INDEPENDENT_AMBULATORY_CARE_PROVIDER_SITE_OTHER): Payer: Self-pay

## 2018-01-29 DIAGNOSIS — Z0289 Encounter for other administrative examinations: Secondary | ICD-10-CM

## 2018-01-29 DIAGNOSIS — Z79899 Other long term (current) drug therapy: Secondary | ICD-10-CM

## 2018-01-30 LAB — CBC
Hematocrit: 36.6 % (ref 34.0–46.6)
Hemoglobin: 11.8 g/dL (ref 11.1–15.9)
MCH: 29.2 pg (ref 26.6–33.0)
MCHC: 32.2 g/dL (ref 31.5–35.7)
MCV: 91 fL (ref 79–97)
PLATELETS: 347 10*3/uL (ref 150–450)
RBC: 4.04 x10E6/uL (ref 3.77–5.28)
RDW: 13.5 % (ref 12.3–15.4)
WBC: 5.3 10*3/uL (ref 3.4–10.8)

## 2018-01-30 LAB — COMPREHENSIVE METABOLIC PANEL
A/G RATIO: 1.7 (ref 1.2–2.2)
ALBUMIN: 4.4 g/dL (ref 3.5–5.5)
ALT: 11 IU/L (ref 0–32)
AST: 10 IU/L (ref 0–40)
Alkaline Phosphatase: 58 IU/L (ref 39–117)
BILIRUBIN TOTAL: 0.3 mg/dL (ref 0.0–1.2)
BUN / CREAT RATIO: 13 (ref 9–23)
BUN: 10 mg/dL (ref 6–20)
CHLORIDE: 103 mmol/L (ref 96–106)
CO2: 26 mmol/L (ref 20–29)
CREATININE: 0.76 mg/dL (ref 0.57–1.00)
Calcium: 9.1 mg/dL (ref 8.7–10.2)
GFR calc Af Amer: 129 mL/min/{1.73_m2} (ref >59)
GFR calc non Af Amer: 112 mL/min/{1.73_m2} (ref >59)
Globulin, Total: 2.6 g/dL (ref 1.5–4.5)
Glucose: 77 mg/dL (ref 65–99)
POTASSIUM: 4.4 mmol/L (ref 3.5–5.2)
Sodium: 142 mmol/L (ref 134–144)
Total Protein: 7 g/dL (ref 6.0–8.5)

## 2018-01-30 LAB — LAMOTRIGINE LEVEL: LAMOTRIGINE LVL: 8.1 ug/mL (ref 2.0–20.0)

## 2018-02-03 ENCOUNTER — Telehealth: Payer: Self-pay

## 2018-02-03 NOTE — Telephone Encounter (Signed)
-----   Message from Anson FretAntonia B Ahern, MD sent at 01/31/2018 10:33 AM EDT ----- Labs look great thanks

## 2018-02-03 NOTE — Telephone Encounter (Signed)
I called pt, spoke to pt's father, Gerlene BurdockRichard, [facility], advised him that pt's lab work is great. Pt's father verbalized understanding and had no questions at this time.

## 2018-07-13 ENCOUNTER — Ambulatory Visit: Payer: Medicaid Other | Admitting: Neurology

## 2018-12-27 ENCOUNTER — Other Ambulatory Visit: Payer: Self-pay | Admitting: Neurology

## 2019-01-27 ENCOUNTER — Other Ambulatory Visit: Payer: Self-pay | Admitting: Neurology

## 2019-01-28 ENCOUNTER — Other Ambulatory Visit: Payer: Self-pay | Admitting: Neurology

## 2019-02-01 ENCOUNTER — Telehealth: Payer: Self-pay | Admitting: *Deleted

## 2019-02-01 ENCOUNTER — Other Ambulatory Visit: Payer: Self-pay | Admitting: Neurology

## 2019-02-01 NOTE — Telephone Encounter (Signed)
I called [facility] and spoke with Patty. Interaction ID: [id number] Completed PA for Lamotrigine ER 200 mg. Request approved for one year starting 02/01/2019 through 01/27/2020. Approval # [id number].   I called [facility] and notified staff of the approval. They confirmed and will process it. They will send pt a text message when the medication is ready.

## 2019-02-09 ENCOUNTER — Ambulatory Visit (INDEPENDENT_AMBULATORY_CARE_PROVIDER_SITE_OTHER): Payer: Medicaid Other | Admitting: Neurology

## 2019-02-09 ENCOUNTER — Encounter: Payer: Self-pay | Admitting: *Deleted

## 2019-02-09 ENCOUNTER — Telehealth: Payer: Self-pay | Admitting: *Deleted

## 2019-02-09 ENCOUNTER — Encounter: Payer: Self-pay | Admitting: Neurology

## 2019-02-09 ENCOUNTER — Other Ambulatory Visit: Payer: Self-pay

## 2019-02-09 DIAGNOSIS — G40009 Localization-related (focal) (partial) idiopathic epilepsy and epileptic syndromes with seizures of localized onset, not intractable, without status epilepticus: Secondary | ICD-10-CM

## 2019-02-09 MED ORDER — LAMOTRIGINE ER 200 MG PO TB24: 1.0000 | ORAL_TABLET | Freq: Two times a day (BID) | ORAL | 4 refills | Status: DC

## 2019-02-09 NOTE — Telephone Encounter (Signed)
I spoke with the patient and obtained consent to file insurance for telephone visit. Pt understands the limitations with phone visit. She confirmed name & DOB and provided updates to chart. No changes to history. Meds updated. Phone call transferred to Dr. Jaynee Eagles for the visit.

## 2019-02-09 NOTE — Progress Notes (Signed)
[facility]    Provider:  Dr Lucia GaskinsAhern Referring Provider: Barbie BannerWilson, Fred H, MD Primary Care Physician:  Barbie BannerWilson, Fred H, MD   CC: Seizure  Virtual Visit via Telephone Note  I connected with Martha Wells on 02/09/19 at 10:30 AM EDT by telephone and verified that I am speaking with the correct person using two identifiers.  Location: Patient: home Provider: office   I discussed the limitations, risks, security and privacy concerns of performing an evaluation and management service by telephone and the availability of in person appointments. I also discussed with the patient that there may be a patient responsible charge related to this service. The patient expressed understanding and agreed to proceed.  Follow Up Instructions:   I discussed the assessment and treatment plan with the patient. The patient was provided an opportunity to ask questions and all were answered. The patient agreed with the plan and demonstrated an understanding of the instructions.   The patient was advised to call back or seek an in-person evaluation if the symptoms worsen or if the condition fails to improve as anticipated.  I provided 15 minutes of non-face-to-face time during this encounter.   Anson FretAhern, Aragorn Recker B, MD  Interval history February 09, 2019: Patient is doing incredibly well, she has been very stable on her anti-epilepsy regimen.  Occasionally she will have a seizure aura but it never does progress to a seizure.  Today we discussed think she should just stay on the same regimen, she has been doing well, no side effects, she is very compliant and very lovely.  At this point we will continue her on her medications and follow-up with her in 6 months to a year.  Interval history: Doing well. She has had her aura numbness in the face and arm. This precedes a seizure. She has had several of these episodes. Very stereotyped. Often when eating. They are sporadic and have not progressed  to a seizure. Getting less as Lamictal. Will get a Lamictal level but she already took the pill so will need to come back for a trough level. Appears to have these episodes before next Lamotrigine is due, will switch to XR formulation and check trough level in 2 weeks  Interval history 12/31/2016: This is a 23 year old here for follow-up of seizures. 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 Three-day EEG was abnormal with right-sided sharp epileptiform discharges consistent with her left-sided symptoms. She was started on Lamictal with good results. At last appointment we titrated her to Lamictal 125 mg twice daily. She is here with her father who provides most information. She has had 3 incidents of her numbness and felt like she was going to have a seizure but didn't. Had one in January and another in February and one recently that felt like the precursor to her seizures. 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 Numbness in the left hand.  She has a job Psychologist, occupationalcoach. Discussed management and decided to increase Lamictal. She has an aid that helps her. She may be working at [employer]. She is no longer at [employer]. She is getting her braces out soon.   Interval History 07/02/2016: She is doing well, no more seizures. They had a wonderful thanksgiving. She has had some numbness and blanking out. She felt  a little numbness. She felt like she was going to have a seizure but did not have a seizure. Twice. Fingers are getting cold and the tips get white. Discussed we will increase her Lamictal to 125mg  twice daily. Discussed Raynaud's phenomenon.   Interval history 12/12/2015: 23 y.o. female here as a referral from Dr. Andrey CampanileWilson for seizures. 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 Three-day prolonged EEG was abnormal with right-sided sharp epileptiform discharges seen consistent with her left-sided symptoms. She was started on Lamictal. Feeling better. She has had a few episodes, but not like before. She is in the process of titrating and is on 100mg  in the morning and 75mg  at night. Her blood pressure has been low. She doesn't drink in between meals. She drinks at meals only, maybe only 10oz a day. She is not drinking enough fluids. Friday she was dizzy, she gets light headed and nauseated, like she is going to pass out. Her BP is low today 84/53. They have a hard time getting her to eat and drink. Discussed taking in more fluids and salt. Her mood is better on the Lamictal. She feels happier, mood is better, not tired unless she has a full day. The last time she had an episode was several weeks ago and she was not on the full dose of the medication. No sides effects to the Lamictal.   Interval history 10/26/2015: This is a lovely patient is here with her dad for follow-up after a 3 day ambulatory video EEG that was abnormal. Patient was started on Keppra at last appointment however she is having side effects including behavior changes. Her long discussion about epilepsy with father and patient. We'll start Lamictal. Lamictal is a great medication for both seizures and is also used for mood disorders so does not cause behavioral side effects like the Keppra. We'll continue Keppra while we titrating up on Lamictal.  PHYSICAN EEG CONCLUSION/IMPRESSION: This was an abnormal prolonged ambulatory 72-hour video EEG. Sharp epileptiform discharges seen in the right hemisphere -right fronto-temporal (F8/T4/T6), temporal (T4/T6 regions). These discharges were present while awake but became  more frequent during sleep. Also seen during sleep were isolated bursts of sharp (as well as spike and wave), generalized, frontally-predominant discharges. These discharges were fairly frequent. No electrographic or electroclinical events present. There was no focal or background slowing seen.  There were 4 push button events. These did not correlate with any changes on the EEG.  Owing to this prolonged VEEG being abnormal with focal abnormalities (right hemispheric discharges), if not recently obtained, a brain MRI and optimization of the anticonvulsant medication is recommended.  HPI: Martha Wells is a 23 y.o. female here as a referral from Dr. Andrey CampanileWilson for seizures. Has a history of developmental delay and seizures, she was not on medication when see in the ED 09/08/2015 for gtcs. Never been on AEDs. Parents here and provide most information. She has anorexia. BP in the Ed was 86/69. She also has reported staring spells. She had her first seizure as an infant at 6 weeks. Then not again until 2 years ago when she had an episode of numbness on the left side and inability to speak. She had another one a year ago and then recently. Last year she passed out eating lunch, they were told she had a  seizure and her blood glucose was 40 and described as shaking on the floor. Teacher said this was def a seizure in March or April. 2 years ago it happened at the table c/o numbness on the left side face and arm, speech become impaired then it passes but she is aware and responsive and interactive and these happen sporadically once in a month. Last Friday she was getting ready for work, dad heard a fall and vocalizations, she was twitching and on her side and had vomited, she was not responsive, no tongue biting, she was breathing, she was trying to speak but couldn't and trying to get up. Eyes were closed and fluttering a little, she heard his voice. She was sitting up in a few minutes and disoriented.She  remembers eating and she noticed numbness in the left hand and left face and remembers falling and hitting the floor but she is not sure the next thing she remembers is the paramedics around her. They started her on Keppra and sh eis a litle sleepy. She graduated high school. She is taking some online classes. She is seeing a therapist and has been told these episodes are due to stress.   Reviewed notes, labs and imaging from outside physicians, which showed:  Patient seen in the ED 09/08/2015. Per notes presenting to the ED with a grand mal seizure that occurred just PTA. Witnessed by patient's father, now at the bedside. Father states that the patient was sitting on a chair in the kitchen, fell to the floor, and began having full body convulsions. Positive for urinary incontinence and vomiting. Patient also complains of abdominal pain, which she indicates is chronic. Patient states that she hurts "a little bit." Pain is nonradiating and is accompanied by nausea. Pt has a history of seizures, but has not had one for over a year, and is not on medication for them. Pt and patient's parents confirm that the patient has never been on seizure medication before. 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 Put on OCPs and increased diet, period returned. EMS reports pt was CAOx4 upon their arrival with no seizure activity noted. Patient's parents at the bedside confirm pt is behaving normally. Pt is currently menstruating. Patient's parents also state that the patient has had intermittent "episodes" that include the patient's face unilaterally becoming numb, followed by a period of 30 seconds to 1 minute where the patient stares straight ahead and is not arousable.Her BP was 86/69 but she was asymptomatic.   MRi of the brain 09/2013: Personally reviewed and agree with the  following IMPRESSION: Chronic hemorrhagic infarction in the parietal lobe bilaterally left greater than right. Smaller areas of chronic infarction in the occipital lobes bilaterally. No acute abnormality.  IMPRESSION: No acute abnormality head, face or cervical spine. Remote bilateral parietal and occipital lobe infarcts.  Seen in the ED 2/3. Urine rapid drug screen negative, pregnancy test negative, CMP and CBC normal, EtOH negative.   Her emergency room notes on 09/08/2015, patient presented to the ED with reports of generalized tonic-clonic seizure witnessed by patient's father. Father states that the patient was sitting on a chair in the kitchen and fell to the floor and began having full body convulsions. Positive for urinary incontinence and vomiting. EMS reported patient was alert and oriented 4 upon their arrival with no seizure activity noted.  Patient was at baseline in the emergency room. He spoke with Dr. Amada JupiterKirkpatrick who started the patient on 500 mg twice a day of Keppra with outpatient follow-up in neurology.   Review of Systems: Patient complains of symptoms per HPI as well as the following symptoms: no rash, no CP, no SOB, no fatigue. Pertinent negatives and positives per HPI. All others negative.   Social History   Socioeconomic History   Marital status: Single    Spouse name: Not on file   Number of children: 0   Years of education: 12   Highest education level: Not on file  Occupational History   Occupation: [employer]   Financial resource strain: Not on file   Food insecurity    Worry: Not on file    Inability: Not on file   Transportation needs    Medical: Not on file    Non-medical: Not on file  Tobacco Use   Smoking status: Never Smoker   Smokeless tobacco: Never Used  Substance and Sexual Activity   Alcohol use: No   Drug use: No   Sexual activity: Not on file  Lifestyle   Physical activity    Days per week: Not on  file    Minutes per session: Not on file   Stress: Not on file  Relationships   Social connections    Talks on phone: Not on file    Gets together: Not on file    Attends religious service: Not on file    Active member of club or organization: Not on file    Attends meetings of clubs or organizations: Not on file    Relationship status: Not on file   Intimate partner violence    Fear of current or ex partner: Not on file    Emotionally abused: Not on file    Physically abused: Not on file    Forced sexual activity: Not on file  Other Topics Concern   Not on file  Social History Narrative   Lives with parents   Caffeine use: daily iced tea, mostly water   Right handed    Family History  Problem Relation Age of Onset   Diabetes Mother    Breast cancer Maternal Grandmother    Breast cancer Paternal Grandmother    Heart disease Maternal Grandfather    Seizures Neg Hx    Colon cancer Neg Hx    Stomach cancer Neg Hx    Esophageal cancer Neg Hx    Pancreatic cancer Neg Hx    Liver disease Neg Hx     Past Medical History:  Diagnosis Date   Avoidant or restrictive food intake disorder    Hx of foot surgery 2008   reconstruction- right    Seizures (HCC)    385 weeks old    Past Surgical History:  Procedure Laterality Date   FOOT SURGERY Right 2008   reconstruction   TYMPANOSTOMY TUBE PLACEMENT      Current Outpatient Medications  Medication Sig Dispense Refill   LamoTRIgine 200 MG TB24 24 hour tablet Take 1 tablet (200 mg total) by mouth 2 (two) times daily. 180 tablet 4   Norgestim-Eth Estrad Triphasic (TRI-SPRINTEC PO) Take 1 tablet by mouth daily with supper.     ondansetron (ZOFRAN-ODT) 4 MG disintegrating tablet DISSOLVE 1 TABLET IN MOUTH THREE TIMES DAILY AS NEEDED FOR NAUSEA     Current Facility-Administered Medications  Medication Dose Route Frequency Provider Last Rate Last Dose   0.9 %  sodium chloride infusion  500 mL Intravenous  Continuous Hilarie Fredrickson, MD        Allergies as of 02/09/2019   (No Known Allergies)    Vitals: There were no vitals taken for this visit. Last Weight:  Wt Readings from Last 1 Encounters:  01/08/18 108 lb (49 kg)   Last Height:   Ht Readings from Last 1 Encounters:  01/08/18  (1.575 m)   Prior exam: Physical exam: Exam: Gen: NAD, conversant, well nourised, well groomed                     Eyes: Conjunctivae clear without exudates or hemorrhage  Neuro: Detailed Neurologic Exam  Speech:    Speech is normal; fluent and spontaneous with normal comprehension.  Cognition:    The patient is oriented to person, place, and time;     recent and remote memory intact;     language fluent;     normal attention, concentration;    fund of knowledge impaired Cranial Nerves:    The pupils are equal, round, and reactive to light. Visual fields are full to finger confrontation. Extraocular movements are intact. Trigeminal sensation is intact and the muscles of mastication are normal. The face is symmetric. The palate elevates in the midline. Hearing intact. Voice is normal. Shoulder shrug is normal. The tongue has normal motion without fasciculations.   Motor Observation:    no involuntary movements noted. Tone:    Normal muscle tone.      Strength:    Strength is V/V in the upper and lower limbs.        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 MRI of the brain showed chronic hemorrhagic infarctions in the parietal lobes bilaterally left greater than right and a smaller area of chronic infarction in the occipital lobes bilaterally. Three-day EEG showed right-sided sharp epileptiform discharges consistent with her auras which are left-sided numbness.  Patient is doing well, she continues to have be occasional seizure aura which does not  progress to seizures.  She is not driving.  She is very lovely at this point we will discontinue her medications and follow-up with her in 6 months to a year.  Do not drive unless seizure free for at least 6 months. Discussed seizure precautions. Call EMS with any other seizure-like activity. We did discuss SUDEP again.   Naomie Dean, MD  [facility] [address] [place], [place] [address]  Phone [phone] Fax [phone]

## 2019-04-06 ENCOUNTER — Other Ambulatory Visit: Payer: Self-pay | Admitting: Neurology

## 2019-04-06 ENCOUNTER — Telehealth: Payer: Self-pay | Admitting: Neurology

## 2019-04-06 NOTE — Telephone Encounter (Signed)
Pt's father called wanting to verify that the Mychart message was received.

## 2019-04-06 NOTE — Telephone Encounter (Signed)
I sent him a mychart message back.

## 2019-09-21 ENCOUNTER — Telehealth: Payer: Self-pay | Admitting: Neurology

## 2019-09-21 NOTE — Telephone Encounter (Signed)
Pt's mother Martha [place] called stating that the pt has been experiencing numbness in her L arm for about two weeks and she says that this normally happens before a seizure so she is concerned and would like to speak to the RN. Please advise.

## 2019-09-22 NOTE — Telephone Encounter (Signed)
I spoke with the pt's mother and let her know Dr Lucia Gaskins agreed. We recommend she follow-up with PCP if the discomfort persists and they can provide further testing or referral if needed. Pt last seen July 2020. Darl Pikes will have patient call back to schedule a follow-up for 2021. I let her know she can see Amy NP, Dr. Trevor Mace partner. She verbalized understanding and appreciation for the call.

## 2019-09-22 NOTE — Telephone Encounter (Signed)
I called the pt's mother back. Pt hasn't had any seizures since she started the medication. She developed numbness in L arm going into hand and off and on the last 2 weeks. Pt was crying previously and saying it hurt. They took her to PCP and they weren't sure if it was a pinched nerve. They told her to call back in a week if it wasn't better. She said any pressure including BP cuff made it worse. This is different than her previous auras of numbness per pt report. In the past she may have numbness in her face. Pt still gets auras but she hasn't had a full-on seizure. I recommended that they f/u with PCP as this could be an orthopedic issue. However I let her know I would send the message to Dr. Lucia Gaskins to see what she advises.

## 2019-09-22 NOTE — Telephone Encounter (Signed)
Agreed thanks!

## 2019-09-22 NOTE — Telephone Encounter (Signed)
Patient  Mother called back in regards to VM and missed call.  Please follow up

## 2019-09-22 NOTE — Telephone Encounter (Signed)
Called pt's mother back and LVM (ok per DPR) asking for call back. Advised I would like to confirm pt's dose of the Lamotrigine and discuss then we will see what Dr. Lucia Gaskins recommends. Left office hours in message.

## 2020-02-27 ENCOUNTER — Other Ambulatory Visit: Payer: Self-pay | Admitting: Neurology

## 2020-03-31 ENCOUNTER — Other Ambulatory Visit: Payer: Self-pay | Admitting: Neurology

## 2020-03-31 ENCOUNTER — Telehealth: Payer: Self-pay | Admitting: Neurology

## 2020-03-31 MED ORDER — LAMOTRIGINE ER 200 MG PO TB24: ORAL_TABLET | ORAL | 3 refills | Status: DC

## 2020-03-31 NOTE — Telephone Encounter (Signed)
Patient's father, Gerlene Burdock, called to request refill on lamotrigine. He states that she only has a pill for tonight. Prescription was sent to the requested pharmacy. Patient's father indicated that they do need to call and make an appointment. He is encouraged to call or email through MyChart to request an appointment.

## 2020-03-31 NOTE — Telephone Encounter (Signed)
Duplicate, from today's phone call.

## 2021-04-19 ENCOUNTER — Telehealth: Payer: Self-pay | Admitting: Neurology

## 2021-04-19 MED ORDER — LAMOTRIGINE ER 200 MG PO TB24: ORAL_TABLET | ORAL | 0 refills | Status: DC

## 2021-04-19 NOTE — Telephone Encounter (Signed)
Pt is requesting a refill for LamoTRIgine 200 MG TB24 24 hour tablet.  Pharmacy: [facility] [phone]

## 2021-04-19 NOTE — Telephone Encounter (Signed)
One refill sent. Pt has an appt on 07/25/21.

## 2021-06-04 ENCOUNTER — Ambulatory Visit (INDEPENDENT_AMBULATORY_CARE_PROVIDER_SITE_OTHER): Payer: Self-pay | Admitting: Neurology

## 2021-06-04 DIAGNOSIS — G40209 Localization-related (focal) (partial) symptomatic epilepsy and epileptic syndromes with complex partial seizures, not intractable, without status epilepticus: Secondary | ICD-10-CM

## 2021-06-05 NOTE — Progress Notes (Signed)
[facility]    Provider:  Dr Lucia Gaskins Referring Provider: Barbie Banner, MD Primary Care Physician:  Barbie Banner, MD    CC:  Seizure  Virtual Visit via Telephone Note  I connected with Martha Wells on 06/05/21 at  2:00 PM EDT by telephone and verified that I am speaking with the correct person using two identifiers.  Location: Patient: home Provider: office   I discussed the limitations, risks, security and privacy concerns of performing an evaluation and management service by telephone and the availability of in person appointments. I also discussed with the patient that there may be a patient responsible charge related to this service. The patient expressed understanding and agreed to proceed.  Follow Up Instructions:   I discussed the assessment and treatment plan with the patient. The patient was provided an opportunity to ask questions and all were answered. The patient agreed with the plan and demonstrated an understanding of the instructions.   The patient was advised to call back or seek an in-person evaluation if the symptoms worsen or if the condition fails to improve as anticipated.  I provided 2 minutes of non-face-to-face time during this encounter.   Martha Fret, MD  Interval history 06/05/2021: Doing well, stable, no problems, cont current therapy  Interval history February 09, 2019: Patient is doing incredibly well, she has been very stable on her anti-epilepsy regimen.  Occasionally she will have a seizure aura but it never does progress to a seizure.  Today we discussed think she should just stay on the same regimen, she has been doing well, no side effects, she is very compliant and very lovely.  At this point we will continue her on her medications and follow-up with her in 6 months to a year.  Interval history: Doing well. She has had her aura numbness in the face and arm. This precedes a seizure. She has had several of these  episodes. Very stereotyped. Often when eating. They are sporadic and have not progressed to a seizure. Getting less as Lamictal. Will get a Lamictal level but she already took the pill so will need to come back for a trough level. Appears to have these episodes before next Lamotrigine is due, will switch to XR formulation and check trough level in 2 weeks   Interval history 12/31/2016: This is a 25 year old here for follow-up of seizures. 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 Three-day EEG was abnormal with right-sided sharp epileptiform discharges consistent with her left-sided symptoms. She was started on Lamictal with good results. At last appointment we titrated her to Lamictal 125 mg twice daily. She is here with her father who provides most information. She has had 3 incidents of her numbness and felt like she was going to have a seizure but didn't. Had one in January and another in February and one recently that felt like the precursor to her seizures. 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 Numbness in the left hand.  She has a job Psychologist, occupational. Discussed management and decided to increase Lamictal. She has an aid that helps her. She may be working at [employer]. She is no longer at [employer]. She is getting her braces out soon.   Interval History 07/02/2016: She is doing well, no more  seizures. They had a wonderful thanksgiving. She has had some numbness and blanking out. She felt a little numbness. She felt like she was going to have a seizure but did not have a seizure. Twice. Fingers are getting cold and the tips get white. Discussed we will increase her Lamictal to 125mg  twice daily. Discussed Raynaud's phenomenon.     Interval history 12/12/2015:  25 y.o. female here as a  referral from Dr. 12 for seizures. 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 Three-day prolonged EEG was abnormal with right-sided sharp epileptiform discharges seen consistent with her left-sided symptoms. She was started on Lamictal. Feeling better. She has had a few episodes, but not like before. She is in the process of titrating and is on 100mg  in the morning and 75mg  at night. Her blood pressure has been low. She doesn't drink in between meals. She drinks at meals only, maybe only 10oz a day. She is not drinking enough fluids. Friday she was dizzy, she gets light headed and nauseated, like she is going to pass out. Her BP is low today 84/53. They have a hard time getting her to eat and drink. Discussed taking in more fluids and salt. Her mood is better on the Lamictal. She feels happier, mood is better, not tired unless she has a full day. The last time she had an episode was several weeks ago and she was not on the full dose of the medication.  No sides effects to the Lamictal.    Interval history 10/26/2015: This is a lovely patient is here with her dad for follow-up after a 3 day ambulatory video EEG that was abnormal. Patient was started on Keppra at last appointment however she is having side effects including behavior changes. Her long discussion about epilepsy with father and patient. We'll start Lamictal. Lamictal is a great medication for both seizures and is also used for mood disorders so does not cause behavioral side effects like the Keppra. We'll continue Keppra while we titrating up on Lamictal.  PHYSICAN  EEG CONCLUSION/IMPRESSION: This was an abnormal prolonged ambulatory 72-hour video EEG.  Sharp epileptiform discharges seen in the right hemisphere -right fronto-temporal  (F8/T4/T6), temporal (T4/T6 regions). These discharges were present while awake but became more frequent during sleep. Also seen during sleep were isolated bursts of sharp (as well as spike and wave), generalized, frontally-predominant discharges. These discharges were fairly frequent. No electrographic or electroclinical events present. There was no focal or background slowing seen.   There were 4 push button events. These did not correlate with any changes on the EEG.   Owing to this prolonged VEEG being abnormal with focal abnormalities (right hemispheric discharges), if not recently obtained, a brain MRI and optimization of the anticonvulsant medication is recommended.   HPI:  Martha Wells is a 26 y.o. female here as a referral from Dr. 06[birth year]-2001 for seizures. Has a history of developmental delay and seizures, she was not on medication when see in the ED 09/08/2015 for gtcs. Never been on AEDs. Parents here and provide most information. She has anorexia. BP in the Ed was 86/69. She also has reported staring spells. She had her first seizure as an infant at 6 weeks. Then not again until 2 years ago when she had an episode of numbness on the left side  and inability to speak.  She had another one a year ago and then recently. Last year she passed out eating lunch, they were told she had a seizure and her blood glucose was 40 and described as shaking on the floor. Teacher said this was def a seizure in March or April. 2 years ago it happened at the table c/o numbness on the left side face and arm, speech become impaired then it passes but she is aware and responsive and interactive and these happen sporadically once in a month.  Last Friday she was getting ready for work, dad heard a fall and vocalizations, she was twitching and on her side and had vomited, she was not responsive, no tongue biting, she was breathing, she was trying to speak but couldn't and trying to get up. Eyes were closed and fluttering  a little, she heard his voice. She was sitting up in a few minutes and disoriented.She remembers eating and she noticed numbness in the left hand and left face and remembers falling and hitting the floor but she is not sure the next thing she remembers is the paramedics around her. They started her on Keppra and sh eis a litle sleepy. She graduated high school. She is taking some online classes. She is seeing a therapist and has been told these episodes are due to stress.    Reviewed notes, labs and imaging from outside physicians, which showed:   Patient seen in the ED 09/08/2015. Per notes presenting to the ED with a grand mal seizure that occurred just PTA. Witnessed by patient's father, now at the bedside. Father states that the patient was sitting on a chair in the kitchen, fell to the floor, and began having full body convulsions. Positive for urinary incontinence and vomiting. Patient also complains of abdominal pain, which she indicates is chronic. Patient states that she hurts "a little bit." Pain is nonradiating and is accompanied by nausea. Pt has a history of seizures, but has not had one for over a year, and is not on medication for them. Pt and patient's parents confirm that the patient has never been on seizure medication before. 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 Put on OCPs and increased diet, period returned. EMS reports pt was CAOx4 upon their arrival with no seizure activity noted. Patient's parents at the bedside confirm pt is behaving normally. Pt is currently menstruating. Patient's parents also state that the patient has had intermittent "episodes" that include the patient's face unilaterally becoming numb, followed by a period of 30 seconds to 1 minute where the patient stares straight ahead and is not arousable.Her BP was 86/69 but she was  asymptomatic.    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 Smaller areas of chronic infarction in the occipital lobes bilaterally. No acute abnormality.   IMPRESSION: No acute abnormality head, face or cervical spine.  Remote bilateral parietal and occipital lobe infarcts.   Seen in the ED 2/3. Urine rapid drug screen negative, pregnancy test negative, CMP and CBC normal, EtOH negative.    Her emergency room notes on 09/08/2015, patient presented to the ED with reports of generalized tonic-clonic seizure witnessed by patient's father. Father states that the patient was sitting on a chair  in the kitchen and fell to the floor and began having full body convulsions. Positive for urinary incontinence and vomiting. EMS reported patient was alert and oriented 4 upon their arrival with no seizure activity noted. Patient was at baseline in the emergency room. He spoke with Dr. Amada Jupiter who started the patient on 500 mg twice a day of Keppra with outpatient follow-up in neurology.    Review of Systems: Patient complains of symptoms per HPI as well as the following symptoms: no rash, no CP, no SOB, no fatigue. Pertinent negatives and positives per HPI. All others negative.   Social History   Socioeconomic History   Marital status: Single    Spouse name: Not on file   Number of children: 0   Years of education: 12   Highest education level: Not on file  Occupational History   Occupation: [employer]  Tobacco Use   Smoking status: Never   Smokeless tobacco: Never  Vaping Use   Vaping Use: Never used  Substance and Sexual Activity   Alcohol use: No   Drug use: No   Sexual activity: Not on file  Other Topics Concern   Not on file  Social History Narrative   Lives with parents   Caffeine use: daily iced tea, mostly water   Right handed   Social  Determinants of Health   Financial Resource Strain: Not on file  Food Insecurity: Not on file  Transportation Needs: Not on file  Physical Activity: Not on file  Stress: Not on file  Social Connections: Not on file  Intimate Partner Violence: Not on file    Family History  Problem Relation Age of Onset   Diabetes Mother    Breast cancer Maternal Grandmother    Breast cancer Paternal Grandmother    Heart disease Maternal Grandfather    Seizures Neg Hx    Colon cancer Neg Hx    Stomach cancer Neg Hx    Esophageal cancer Neg Hx    Pancreatic cancer Neg Hx    Liver disease Neg Hx     Past Medical History:  Diagnosis Date   Avoidant or restrictive food intake disorder    Hx of foot surgery 2008   reconstruction- right    Seizures (HCC)    25 weeks old    Past Surgical History:  Procedure Laterality Date   FOOT SURGERY Right 2008   reconstruction   TYMPANOSTOMY TUBE PLACEMENT      Current Outpatient Medications  Medication Sig Dispense Refill   LamoTRIgine 200 MG TB24 24 hour tablet TAKE 1 TABLET BY MOUTH TWICE DAILY 180 tablet 0   Norgestim-Eth Estrad Triphasic (TRI-SPRINTEC PO) Take 1 tablet by mouth daily with supper.     ondansetron (ZOFRAN-ODT) 4 MG disintegrating tablet DISSOLVE 1 TABLET IN MOUTH THREE TIMES DAILY AS NEEDED FOR NAUSEA     Current Facility-Administered Medications  Medication Dose Route Frequency Provider Last Rate Last Admin   0.9 %  sodium chloride infusion  500 mL Intravenous Continuous Hilarie Fredrickson, MD        Allergies as of 06/04/2021   (No Known Allergies)    Vitals: There were no vitals taken for this visit. Last Weight:  Wt Readings from Last 1 Encounters:  01/08/18 108 lb (49 kg)   Last Height:   Ht Readings from Last 1 Encounters:  01/08/18 5\' 2"  (1.575 m)   On the phone  Physical exam: Exam: Gen: NAD, conversant, pleasant  Neuro: Detailed Neurologic Exam  Speech:    Speech is normal; fluent and spontaneous with  normal comprehension.  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 MRI of the brain showed chronic hemorrhagic infarctions in the parietal lobes bilaterally left greater than right and a smaller area of chronic infarction in the occipital lobes bilaterally. Three-day EEG showed right-sided sharp epileptiform discharges consistent with her auras which are left-sided numbness.  Patient is still doing well, she continues to have be occasional seizure aura which does not progress to seizures.  She is not driving.  She is very lovely at this point we will discontinue her medications and follow-up with her in 6 months to a year.   Naomie Dean, MD  [facility] [address] [place], [place] [address]  Phone [phone] Fax [phone]

## 2021-07-06 ENCOUNTER — Other Ambulatory Visit: Payer: Self-pay | Admitting: Neurology

## 2021-07-06 NOTE — Telephone Encounter (Signed)
Need to clarify this Rx with Dr Lucia Gaskins.

## 2021-07-25 ENCOUNTER — Ambulatory Visit: Payer: Medicaid Other | Admitting: Neurology

## 2021-12-27 ENCOUNTER — Encounter: Payer: Self-pay | Admitting: Neurology

## 2022-01-09 ENCOUNTER — Other Ambulatory Visit: Payer: Self-pay | Admitting: Neurology

## 2022-01-09 MED ORDER — LAMOTRIGINE ER 200 MG PO TB24: 1.0000 | ORAL_TABLET | Freq: Two times a day (BID) | ORAL | 4 refills | Status: DC

## 2022-01-15 ENCOUNTER — Emergency Department (HOSPITAL_COMMUNITY)
Admission: EM | Admit: 2022-01-15 | Discharge: 2022-01-16 | Disposition: A | Discharge status: Home / Self Care | Payer: Medicaid Other | Attending: Emergency Medicine | Admitting: Emergency Medicine

## 2022-01-15 ENCOUNTER — Emergency Department (HOSPITAL_COMMUNITY): Payer: Medicaid Other

## 2022-01-15 ENCOUNTER — Encounter (HOSPITAL_COMMUNITY): Payer: Self-pay | Admitting: Emergency Medicine

## 2022-01-15 ENCOUNTER — Other Ambulatory Visit: Payer: Self-pay

## 2022-01-15 DIAGNOSIS — M25551 Pain in right hip: Secondary | ICD-10-CM | POA: Insufficient documentation

## 2022-01-15 DIAGNOSIS — Y92009 Unspecified place in unspecified non-institutional (private) residence as the place of occurrence of the external cause: Secondary | ICD-10-CM | POA: Diagnosis not present

## 2022-01-15 DIAGNOSIS — S3210XA Unspecified fracture of sacrum, initial encounter for closed fracture: Secondary | ICD-10-CM | POA: Insufficient documentation

## 2022-01-15 DIAGNOSIS — R1084 Generalized abdominal pain: Secondary | ICD-10-CM | POA: Diagnosis not present

## 2022-01-15 DIAGNOSIS — W109XXA Fall (on) (from) unspecified stairs and steps, initial encounter: Secondary | ICD-10-CM | POA: Insufficient documentation

## 2022-01-15 DIAGNOSIS — S3992XA Unspecified injury of lower back, initial encounter: Secondary | ICD-10-CM | POA: Diagnosis present

## 2022-01-15 LAB — I-STAT BETA HCG BLOOD, ED (MC, WL, AP ONLY): I-stat hCG, quantitative: <5 m[IU]/mL (ref <5)

## 2022-01-15 MED ORDER — KETOROLAC TROMETHAMINE 30 MG/ML IJ SOLN
30.0000 mg | Freq: Once | INTRAMUSCULAR | Status: AC
  Administered 2022-01-15: 30 mg via INTRAMUSCULAR
  Filled 2022-01-15: qty 1

## 2022-01-15 MED ORDER — OXYCODONE-ACETAMINOPHEN 5-325 MG PO TABS
1.0000 | ORAL_TABLET | Freq: Once | ORAL | Status: AC
  Administered 2022-01-15: 1 via ORAL
  Filled 2022-01-15: qty 1

## 2022-01-15 NOTE — ED Notes (Signed)
Pt ambulated to bathroom with 2+ max assist, yelling that she was in pain. She was able to stand and take several steps after a lot of encouragement from staff and her mother. No bruising noted to right lateral hip area while using the restroom, no deformity or swelling.

## 2022-01-15 NOTE — ED Notes (Signed)
Unable to obtain MSE waiver signature at time of triage due to pt's baseline disability.

## 2022-01-15 NOTE — ED Provider Notes (Signed)
[facility] EMERGENCY DEPARTMENT Provider Note   CSN: [id number] Arrival date & time: 01/15/22  2046     History PMH: seizures, developmental delay from stroke in utero Chief Complaint  Patient presents with   Cloria Spring Gelardi is a 26 y.o. female. Presents after mechanical fall where she lost her balance heading upstairs and she fell onto her right side on a hard floor.  She denies hitting her head or losing consciousness.  She complains of 10 out of 10 right hip pain.  She says she was unable to ambulate and called her parents to come get her and had to be picked up off the ground.   Fall       Home Medications Prior to Admission medications   Medication Sig Start Date End Date Taking? Authorizing Provider  LamoTRIgine 200 MG TB24 24 hour tablet Take 1 tablet (200 mg total) by mouth 2 (two) times daily. 01/09/22   [name], MD  Norgestim-Eth Charlott Holler Triphasic (TRI-SPRINTEC PO) Take 1 tablet by mouth daily with supper.    [provider]  ondansetron (ZOFRAN-ODT) 4 MG disintegrating tablet DISSOLVE 1 TABLET IN MOUTH THREE TIMES DAILY AS NEEDED FOR NAUSEA 01/31/19   [provider]      Allergies    Patient has no known allergies.    Review of Systems   Review of Systems  Musculoskeletal:  Positive for arthralgias.  All other systems reviewed and are negative.   Physical Exam Updated Vital Signs BP (!) 106/93 (BP Location: Right Arm)   Pulse 81   Temp 98.4 F (36.9 C) (Oral)   Resp 20   Ht 5\' 2"  (1.575 m)   Wt 47.6 kg   SpO2 98%   BMI 19.20 kg/m  Physical Exam Vitals and nursing note reviewed.  Constitutional:      General: She is not in acute distress.    Appearance: Normal appearance. She is well-developed. She is not ill-appearing, toxic-appearing or diaphoretic.  HENT:     Head: Normocephalic and atraumatic.     Nose: No nasal deformity.     Mouth/Throat:     Lips: Pink. No lesions.  Eyes:      General: Gaze aligned appropriately. No scleral icterus.       Right eye: No discharge.        Left eye: No discharge.     Conjunctiva/sclera: Conjunctivae normal.     Right eye: Right conjunctiva is not injected. No exudate or hemorrhage.    Left eye: Left conjunctiva is not injected. No exudate or hemorrhage. Pulmonary:     Effort: Pulmonary effort is normal. No respiratory distress.     Comments: There is no chest wall or rib cage tenderness to palpation or any step-offs noted Chest:     Chest wall: No tenderness.  Abdominal:     General: Abdomen is flat.     Palpations: Abdomen is soft.     Tenderness: There is abdominal tenderness. There is no right CVA tenderness, left CVA tenderness, guarding or rebound.     Comments: Mild generalized abdominal tenderness which is chronic per parents report  Musculoskeletal:     Comments: Tenderness to palpation of the lateral right pelvis and hip.  She is unable to range her right hip and has minimal range of motion of her right knee due to causing her hip pain.  She is able to wiggle her ankles and toes.  She has normal 2+  pedal pulses.  She has sensation intact.  She has no L-spine lumbar tenderness to palpation or paraspinal muscular tenderness.  There is no obvious deformity or swelling noted.  Skin:    General: Skin is warm and dry.  Neurological:     Mental Status: She is alert and oriented to person, place, and time.  Psychiatric:        Mood and Affect: Mood normal.        Speech: Speech normal.        Behavior: Behavior normal. Behavior is cooperative.     ED Results / Procedures / Treatments   Labs (all labs ordered are listed, but only abnormal results are displayed) Labs Reviewed  I-STAT BETA HCG BLOOD, ED ([facility], [facility], [facility] ONLY)    EKG None  Radiology DG Hip Unilat  With Pelvis 2-3 Views Right  Result Date: 01/15/2022 CLINICAL DATA:  Fall, pain. EXAM: DG HIP (WITH OR WITHOUT PELVIS) 2-3V RIGHT COMPARISON:  None Available.  FINDINGS: There is no evidence of hip fracture or dislocation. There is no evidence of arthropathy or other focal bone abnormality. IMPRESSION: Negative. Electronically Signed   By: Darliss Cheney M.D.   On: 01/15/2022 21:59    Procedures Procedures   Medications Ordered in ED Medications  ketorolac (TORADOL) 30 MG/ML injection 30 mg (has no administration in time range)  oxyCODONE-acetaminophen (PERCOCET/ROXICET) 5-325 MG per tablet 1 tablet (1 tablet Oral Given 01/15/22 2159)    ED Course/ Medical Decision Making/ A&P                           Medical Decision Making Amount and/or Complexity of Data Reviewed Radiology: ordered.  Risk Prescription drug management.    MDM  This is a 26 y.o. female who presents to the ED with right hip pain after a mechanical fall  My Impression, Plan, and ED Course: Presents after mechanical fall today and has right hip pain.  She has normal vitals but is notably anxious and complaining of significant pain in her right hip.  She is unable to range her right hip due to severe pain.  She is neurovascularly intact in that extremity and no obvious deformities are seen on exam.  I personally ordered, reviewed, and interpreted all laboratory work and imaging and agree with radiologist interpretation. Results interpreted below: X-ray of the right hip returned negative for any fractures.  Since she is still unable to bear weight on that left hip, will obtain CT to r/o occult fracture.      Charting Requirements Additional history is obtained from:  Independent historian and Parent/Guardian External Records from outside source obtained and reviewed including: n/a Social Determinants of Health:   developmental disability Pertinant PMH that complicates patient's illness: developmental disability  Patient Care Problems that were addressed during this visit: - ***: {Problems Addressed:27131} - ***: {Problems Addressed:27131} - ***: {Problems  Addressed:27131} This patient was maintained on a cardiac monitor/telemetry. I personally viewed and interpreted the cardiac monitor which reveals an underlying rhythm of *** Medications given in ED: *** Reevaluation of the patient after these medicines showed that the patient {resolved/improved/worsened:23923::"improved"} I have reviewed home medications and made changes accordingly. *** Critical Care Interventions: *** Consultations: *** Disposition: ***  {Shared/Supervised/Independent:27272}  Portions of this note were generated with Dragon dictation software. Dictation errors may occur despite best attempts at proofreading.       Final Clinical Impression(s) / ED Diagnoses Final diagnoses:  None  Rx / DC Orders ED Discharge Orders     None

## 2022-01-15 NOTE — ED Triage Notes (Signed)
Via EMS from home after fall on right hip. Pt has altered balance at baseline due to developmental disability and falls often at home; today she was ambulating unassisted and was about to go upstairs when she missed the handrail and fell to her side on a hard floor. C/O 10/10 right hip pain. No obvious deformity noted but pt is tearful and anxious on arrival reporting severe discomfort. Pt reports hx seizures but has not had a seizure today. She does not use assistive devices at home and lives with her dad full time. Parents at bedside.

## 2022-01-16 MED ORDER — OXYCODONE-ACETAMINOPHEN 5-325 MG PO TABS: 1.0000 | ORAL_TABLET | Freq: Four times a day (QID) | ORAL | 0 refills | Status: AC | PRN

## 2022-01-16 MED ORDER — ROLLING WALKER/BURGUNDY MISC: 0 refills | Status: DC

## 2022-01-16 NOTE — Discharge Instructions (Addendum)
Please pick up walker at any [facility] store.  [address], [place], [place] [address] [phone]  You can call the number listed above to pick up your walker. I have provided you with a paper prescription for this.  Please call PCP for follow up viist

## 2023-01-16 ENCOUNTER — Encounter: Payer: Self-pay | Admitting: Gastroenterology

## 2023-01-22 ENCOUNTER — Telehealth: Payer: Self-pay | Admitting: *Deleted

## 2023-01-22 ENCOUNTER — Other Ambulatory Visit: Payer: Self-pay | Admitting: *Deleted

## 2023-01-22 MED ORDER — LAMOTRIGINE ER 200 MG PO TB24: 1.0000 | ORAL_TABLET | Freq: Two times a day (BID) | ORAL | 3 refills | Status: DC

## 2023-01-22 NOTE — Telephone Encounter (Signed)
Spoke to dad (checked DPR)  dad states pt needs refill for lamotrigine  pt has current appointment in 04/2023 Per DrAhern ok to fill prescription for a year . Rx sent to pharmacy this evening

## 2023-02-18 ENCOUNTER — Ambulatory Visit: Payer: Medicaid Other | Admitting: Registered"

## 2023-02-21 ENCOUNTER — Ambulatory Visit (INDEPENDENT_AMBULATORY_CARE_PROVIDER_SITE_OTHER): Payer: MEDICAID | Admitting: Gastroenterology

## 2023-02-21 ENCOUNTER — Encounter: Payer: Self-pay | Admitting: Gastroenterology

## 2023-02-21 VITALS — BP 90/70 | Ht 61.0 in | Wt 80.1 lb

## 2023-02-21 DIAGNOSIS — R112 Nausea with vomiting, unspecified: Secondary | ICD-10-CM | POA: Diagnosis not present

## 2023-02-21 DIAGNOSIS — R634 Abnormal weight loss: Secondary | ICD-10-CM | POA: Diagnosis not present

## 2023-02-21 DIAGNOSIS — R1013 Epigastric pain: Secondary | ICD-10-CM | POA: Diagnosis not present

## 2023-02-21 DIAGNOSIS — K219 Gastro-esophageal reflux disease without esophagitis: Secondary | ICD-10-CM | POA: Diagnosis not present

## 2023-02-21 MED ORDER — SUCRALFATE 1 GM/10ML PO SUSP: 1.0000 g | Freq: Two times a day (BID) | ORAL | 1 refills | Status: DC

## 2023-02-21 NOTE — Patient Instructions (Addendum)
We have sent the following medications to your pharmacy for you to pick up at your convenience: Carafate - Two times daily one hour away from other meds.  You have been scheduled for a HIDA scan at [facility] (1st floor) on 03/03/23. Please arrive 30 minutes prior to your scheduled appointment at  700 AM. Make certain not to have anything to eat or drink at least 6 hours prior to your test. Should this appointment date or time not work well for you, please call radiology scheduling at [phone].   You have been scheduled for an appointment with Dr. [name] on 06/24/23 at 2:00 PM . Please arrive 10 minutes early for your appointment.  _____________________________________________________________________ hepatobiliary (HIDA) scan is an imaging procedure used to diagnose problems in the liver, gallbladder and bile ducts. In the HIDA scan, a radioactive chemical or tracer is injected into a vein in your arm. The tracer is handled by the liver like bile. Bile is a fluid produced and excreted by your liver that helps your digestive system break down fats in the foods you eat. Bile is stored in your gallbladder and the gallbladder releases the bile when you eat a meal. A special nuclear medicine scanner (gamma camera) tracks the flow of the tracer from your liver into your gallbladder and small intestine.  During your HIDA scan  You'll be asked to change into a hospital gown before your HIDA scan begins. Your health care team will position you on a table, usually on your back. The radioactive tracer is then injected into a vein in your arm.The tracer travels through your bloodstream to your liver, where it's taken up by the bile-producing cells. The radioactive tracer travels with the bile from your liver into your gallbladder and through your bile ducts to your small intestine.You may feel some pressure while the radioactive tracer is injected into your vein. As you lie on the table, a special gamma  camera is positioned over your abdomen taking pictures of the tracer as it moves through your body. The gamma camera takes pictures continually for about an hour. You'll need to keep still during the HIDA scan. This can become uncomfortable, but you may find that you can lessen the discomfort by taking deep breaths and thinking about other things. Tell your health care team if you're uncomfortable. The radiologist will watch on a computer the progress of the radioactive tracer through your body. The HIDA scan may be stopped when the radioactive tracer is seen in the gallbladder and enters your small intestine. This typically takes about an hour. In some cases extra imaging will be performed if original images aren't satisfactory, if morphine is given to help visualize the gallbladder or if the medication CCK is given to look at the contraction of the gallbladder. This test typically takes 2 hours to complete. ________________________________________________________________________   _______________________________________________________  If your blood pressure at your visit was 140/90 or greater, please contact your primary care physician to follow up on this.  _______________________________________________________ If you are age 64 or younger, your body mass index should be between 19-25. Your Body mass index is 15.14 kg/m. If this is out of the aformentioned range listed, please consider follow up with your Primary Care Provider.   __________________________________________________________  The [facility] providers would like to encourage you to use [facility] to communicate with providers for non-urgent requests or questions.  Due to long hold times on the telephone, sending your provider a message by [facility] may be  a faster and more efficient way to get a response.  Please allow 48 business hours for a response.  Please remember that this is for non-urgent requests.   Due to recent changes in  healthcare laws, you may see the results of your imaging and laboratory studies on MyChart before your provider has had a chance to review them.  We understand that in some cases there may be results that are confusing or concerning to you. Not all laboratory results come back in the same time frame and the provider may be waiting for multiple results in order to interpret others.  Please give [place] 48 hours in order for your provider to thoroughly review all the results before contacting the office for clarification of your results.     Thank you for choosing me and [facility].  Bayley McMichael, PA-C.

## 2023-02-21 NOTE — Progress Notes (Unsigned)
Chief Complaint: Weight loss Primary GI MD: Dr. 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  Has not had a seizure in years per family and patient report. Followed by neuro and is on Lamotrigine.  Patient referred her by Dr. Andrey Campanile for continued weight loss and evaluation of need for a feeding tube. Her weight is currently at an all time low. She is picky about what she eats and has tried gluten free/vegan diets. Has tried protein drinks. Gets nauseated easily and zofran helps.  Recent labwork shows anemia with hgb 11.0 and MCV 82.2 Ferritin 4, Iron 54 Vit B12 211 TSH unrevealing CMP with glucose of 54. Albumin 4.0, protein 7.1  CT ab pelvis wo contrast 01/2022 showed nondisplaced fracture of right sacral ala. Showed moderate stool throughout colon. No gallstones or liver abnormality.  Has been seen by Dr. [name] in 2017 for post prandial epigastric pain. EGD was normal. At that time patient was also struggling with her weight and was 99lbs.  Today she is 80lbs. Patient reports epigastric pain and burning with eating causing her not to eat most of the time. She also has persistent nausea worse with eating. She will even wake up with burning stomach pain. She feels hungry but has a fear of eating due to the pain. Denies melena, hematochezia.  States she has tried multiple antacids in the past including omeprazole, pantoprazole, Pepcid, all without relief and she reported adverse reactions such as worsening nausea.   Past Medical History:  Diagnosis Date   Avoidant or restrictive food intake disorder    Hx of foot surgery 2008   reconstruction- right    Seizures (HCC)    50 weeks old    Past Surgical History:  Procedure Laterality Date   FOOT SURGERY Right 2008   reconstruction   TYMPANOSTOMY TUBE PLACEMENT      Current Outpatient Medications  Medication Sig Dispense  Refill   Ascorbic Acid (VITAMIN C PO) Take 500 mg by mouth daily as needed (immune support). Gummy with zinc     B Complex Vitamins (VITAMIN B COMPLEX PO) Take 1 tablet by mouth daily. gummy     LamoTRIgine 200 MG TB24 24 hour tablet Take 1 tablet (200 mg total) by mouth 2 (two) times daily. 180 tablet 3   Misc. Devices (ROLLING WALKER/BURGUNDY) MISC 1 rolling walker for home use 1 each 0   Norgestim-Eth Estrad Triphasic (TRI-SPRINTEC PO) Take 1 tablet by mouth daily with supper.     Current Facility-Administered Medications  Medication Dose Route Frequency Provider Last Rate Last Admin   0.9 %  sodium chloride infusion  500 mL Intravenous Continuous Hilarie Fredrickson, MD        Allergies as of 02/21/2023   (No Known Allergies)    Family History  Problem Relation Age of Onset   Diabetes Mother    Breast cancer Maternal Grandmother    Breast cancer Paternal Grandmother    Heart disease Maternal Grandfather    Seizures Neg Hx    Colon cancer Neg Hx    Stomach cancer Neg Hx    Esophageal cancer Neg Hx    Pancreatic cancer Neg Hx    Liver disease Neg Hx     Social History   Socioeconomic History   Marital status: Single    Spouse name: Not on file   Number of children: 0  Years of education: 79   Highest education level: Not on file  Occupational History   Occupation: [employer]  Tobacco Use   Smoking status: Never   Smokeless tobacco: Never  Vaping Use   Vaping status: Never Used  Substance and Sexual Activity   Alcohol use: No   Drug use: No   Sexual activity: Not on file  Other Topics Concern   Not on file  Social History Narrative   Lives with parents   Caffeine use: daily iced tea, mostly water   Right handed   Social Determinants of Health   Financial Resource Strain: Not on file  Food Insecurity: Not on file  Transportation Needs: Not on file  Physical Activity: Not on file  Stress: Not on file  Social Connections: Not on file  Intimate Partner Violence:  Not on file    Review of Systems:    Constitutional: No weight loss, fever, chills, weakness or fatigue HEENT: Eyes: No change in vision               Ears, Nose, Throat:  No change in hearing or congestion Skin: No rash or itching Cardiovascular: No chest pain, chest pressure or palpitations   Respiratory: No SOB or cough Gastrointestinal: See HPI and otherwise negative Genitourinary: No dysuria or change in urinary frequency Neurological: No headache, dizziness or syncope Musculoskeletal: No new muscle or joint pain Hematologic: No bleeding or bruising Psychiatric: No history of depression or anxiety    Physical Exam:  Vital signs: There were no vitals taken for this visit.  Constitutional: malnourished, frail Head:  Normocephalic and atraumatic.  Temporal wasting Eyes:   PEERL, EOMI. No icterus. Conjunctiva pink. Respiratory: Respirations even and unlabored. Lungs clear to auscultation bilaterally.   No wheezes, crackles, or rhonchi.  Cardiovascular:  Regular rate and rhythm. No peripheral edema, cyanosis or pallor.  Gastrointestinal:  Soft, nondistended, nontender. No rebound or guarding. Normal bowel sounds. No appreciable masses or hepatomegaly. Rectal:  Not performed.  Msk:  Symmetrical without gross deformities. Without edema, no deformity or joint abnormality.  Neurologic:  Alert and  oriented x4;  grossly normal neurologically.  Skin:   Dry and intact without significant lesions or rashes. Psychiatric: Oriented to person, place and time. Demonstrates good judgement and reason without abnormal affect or behaviors.   RELEVANT LABS AND IMAGING: CBC    Component Value Date/Time   WBC 5.3 01/29/2018 0806   WBC 4.6 09/08/2015 0903   RBC 4.04 01/29/2018 0806   RBC 4.44 09/08/2015 0903   HGB 11.8 01/29/2018 0806   HCT 36.6 01/29/2018 0806   PLT 347 01/29/2018 0806   MCV 91 01/29/2018 0806   MCH 29.2 01/29/2018 0806   MCH 30.2 09/08/2015 0903   MCHC 32.2 01/29/2018  0806   MCHC 33.2 09/08/2015 0903   RDW 13.5 01/29/2018 0806   LYMPHSABS 1.3 09/08/2015 0903   MONOABS 0.3 09/08/2015 0903   EOSABS 0.1 09/08/2015 0903   BASOSABS 0.0 09/08/2015 0903    CMP     Component Value Date/Time   NA 142 01/29/2018 0806   K 4.4 01/29/2018 0806   CL 103 01/29/2018 0806   CO2 26 01/29/2018 0806   GLUCOSE 77 01/29/2018 0806   GLUCOSE 101 (H) 09/08/2015 0903   BUN 10 01/29/2018 0806   CREATININE 0.76 01/29/2018 0806   CALCIUM 9.1 01/29/2018 0806   PROT 7.0 01/29/2018 0806   ALBUMIN 4.4 01/29/2018 0806   AST 10 01/29/2018 0806   ALT 11  01/29/2018 0806   ALKPHOS 58 01/29/2018 0806   BILITOT 0.3 01/29/2018 0806   GFRNONAA 112 01/29/2018 0806   GFRAA 129 01/29/2018 0806     Assessment/Plan:   Nausea and vomiting, unspecified vomiting type Abdominal pain, epigastric Gastroesophageal reflux disease without esophagitis Weight loss  Suspect patient's weight loss in multi-factorial. Could be due to esophagitis, gastritis, PUD although last EGD was normal. Could be secondary to biliary dyskinsia since pain with eating (no HIDA scan but CT showed no gallstones). Also cannot r/o fearful eating/psych related disorder including ARFID. Difficult management since she doesn't tolerate medications - EGD for further evaluation - I thoroughly discussed the procedure with the patient (at bedside) to include nature of the procedure, alternatives, benefits, and risks (including but not limited to bleeding, infection, perforation, anesthesia/cardiac pulmonary complications).  Patient verbalized understanding and gave verbal consent to proceed with procedure.  - HIDA scan to r/o bliiary dyskinesia - unable to tolerate PPI and H2 blocker. Trial of carafate suspension 2-3 times daily taken 1 hour away from other medications. - if all of the above is negative, will defer to PCP or psych. If feeding tube is indicated, will need to refer to IR.  Iron deficiency Discussed getting  iron infusions due to ferritin of 4 and patient declined. Patient is not tolerant to oral iron therapy   Lara Mulch [facility] 02/21/2023, 2:39 PM  Cc: Barbie Banner, MD

## 2023-02-24 NOTE — Progress Notes (Signed)
Noted  

## 2023-02-25 ENCOUNTER — Encounter: Payer: Self-pay | Admitting: Internal Medicine

## 2023-02-25 ENCOUNTER — Ambulatory Visit: Payer: MEDICAID | Admitting: Internal Medicine

## 2023-02-25 VITALS — BP 104/48 | HR 69 | Temp 98.4°F | Resp 15 | Ht 61.0 in | Wt 80.0 lb

## 2023-02-25 DIAGNOSIS — R634 Abnormal weight loss: Secondary | ICD-10-CM | POA: Diagnosis not present

## 2023-02-25 DIAGNOSIS — D508 Other iron deficiency anemias: Secondary | ICD-10-CM

## 2023-02-25 DIAGNOSIS — K219 Gastro-esophageal reflux disease without esophagitis: Secondary | ICD-10-CM | POA: Diagnosis not present

## 2023-02-25 DIAGNOSIS — R1013 Epigastric pain: Secondary | ICD-10-CM | POA: Diagnosis not present

## 2023-02-25 DIAGNOSIS — K3189 Other diseases of stomach and duodenum: Secondary | ICD-10-CM | POA: Diagnosis not present

## 2023-02-25 MED ORDER — SODIUM CHLORIDE 0.9 % IV SOLN: 500.0000 mL | Freq: Once | INTRAVENOUS | Status: DC

## 2023-02-25 NOTE — Progress Notes (Signed)
Expand All Collapse All    Chief Complaint: Weight loss Primary GI MD: Dr. 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   Has not had a seizure in years per family and patient report. Followed by neuro and is on Lamotrigine.   Patient referred her by Dr. Andrey Campanile for continued weight loss and evaluation of need for a feeding tube. Her weight is currently at an all time low. She is picky about what she eats and has tried gluten free/vegan diets. Has tried protein drinks. Gets nauseated easily and zofran helps.   Recent labwork shows anemia with hgb 11.0 and MCV 82.2 Ferritin 4, Iron 54 Vit B12 211 TSH unrevealing CMP with glucose of 54. Albumin 4.0, protein 7.1   CT ab pelvis wo contrast 01/2022 showed nondisplaced fracture of right sacral ala. Showed moderate stool throughout colon. No gallstones or liver abnormality.   Has been seen by Dr. [name] in 2017 for post prandial epigastric pain. EGD was normal. At that time patient was also struggling with her weight and was 99lbs.   Today she is 80lbs. Patient reports epigastric pain and burning with eating causing her not to eat most of the time. She also has persistent nausea worse with eating. She will even wake up with burning stomach pain. She feels hungry but has a fear of eating due to the pain. Denies melena, hematochezia.  States she has tried multiple antacids in the past including omeprazole, pantoprazole, Pepcid, all without relief and she reported adverse reactions such as worsening nausea.         Past Medical History:  Diagnosis Date   Avoidant or restrictive food intake disorder     Hx of foot surgery 2008    reconstruction- right    Seizures (HCC)      36 weeks old               Past Surgical History:  Procedure Laterality Date   FOOT SURGERY Right 2008    reconstruction   TYMPANOSTOMY TUBE PLACEMENT                     Current Outpatient Medications  Medication Sig Dispense Refill   Ascorbic Acid (VITAMIN C PO) Take 500 mg by mouth daily as needed (immune support). Gummy with zinc       B Complex Vitamins (VITAMIN B COMPLEX PO) Take 1 tablet by mouth daily. gummy       LamoTRIgine 200 MG TB24 24 hour tablet Take 1 tablet (200 mg total) by mouth 2 (two) times daily. 180 tablet 3   Misc. Devices (ROLLING WALKER/BURGUNDY) MISC 1 rolling walker for home use 1 each 0   Norgestim-Eth Estrad Triphasic (TRI-SPRINTEC PO) Take 1 tablet by mouth daily with supper.                   Current Facility-Administered Medications  Medication Dose Route Frequency Provider Last Rate Last Admin   0.9 %  sodium chloride infusion  500 mL Intravenous Continuous Hilarie Fredrickson, MD               Allergies as of 02/21/2023   (No Known Allergies)           Family History  Problem Relation Age of Onset   Diabetes Mother     Breast cancer Maternal Grandmother  Breast cancer Paternal Grandmother     Heart disease Maternal Grandfather     Seizures Neg Hx     Colon cancer Neg Hx     Stomach cancer Neg Hx     Esophageal cancer Neg Hx     Pancreatic cancer Neg Hx     Liver disease Neg Hx            Social History         Socioeconomic History   Marital status: Single      Spouse name: Not on file   Number of children: 0   Years of education: 12   Highest education level: Not on file  Occupational History   Occupation: [employer]  Tobacco Use   Smoking status: Never   Smokeless tobacco: Never  Vaping Use   Vaping status: Never Used  Substance and Sexual Activity   Alcohol use: No   Drug use: No   Sexual activity: Not on file  Other Topics Concern   Not on file  Social History Narrative    Lives with parents    Caffeine use: daily iced tea, mostly water    Right handed    Social Determinants of Health    Financial Resource Strain: Not on file  Food Insecurity: Not on file   Transportation Needs: Not on file  Physical Activity: Not on file  Stress: Not on file  Social Connections: Not on file  Intimate Partner Violence: Not on file      Review of Systems:    Constitutional: No weight loss, fever, chills, weakness or fatigue HEENT: Eyes: No change in vision               Ears, Nose, Throat:  No change in hearing or congestion Skin: No rash or itching Cardiovascular: No chest pain, chest pressure or palpitations   Respiratory: No SOB or cough Gastrointestinal: See HPI and otherwise negative Genitourinary: No dysuria or change in urinary frequency Neurological: No headache, dizziness or syncope Musculoskeletal: No new muscle or joint pain Hematologic: No bleeding or bruising Psychiatric: No history of depression or anxiety      Physical Exam:  Vital signs: There were no vitals taken for this visit.   Constitutional: malnourished, frail Head:  Normocephalic and atraumatic.  Temporal wasting Eyes:   PEERL, EOMI. No icterus. Conjunctiva pink. Respiratory: Respirations even and unlabored. Lungs clear to auscultation bilaterally.   No wheezes, crackles, or rhonchi.  Cardiovascular:  Regular rate and rhythm. No peripheral edema, cyanosis or pallor.  Gastrointestinal:  Soft, nondistended, nontender. No rebound or guarding. Normal bowel sounds. No appreciable masses or hepatomegaly. Rectal:  Not performed.  Msk:  Symmetrical without gross deformities. Without edema, no deformity or joint abnormality.  Neurologic:  Alert and  oriented x4;  grossly normal neurologically.  Skin:   Dry and intact without significant lesions or rashes. Psychiatric: Oriented to person, place and time. Demonstrates good judgement and reason without abnormal affect or behaviors.     RELEVANT LABS AND IMAGING: CBC Labs (Brief)          Component Value Date/Time    WBC 5.3 01/29/2018 0806    WBC 4.6 09/08/2015 0903    RBC 4.04 01/29/2018 0806    RBC 4.44 09/08/2015 0903     HGB 11.8 01/29/2018 0806    HCT 36.6 01/29/2018 0806    PLT 347 01/29/2018 0806    MCV 91 01/29/2018 0806    MCH 29.2 01/29/2018 0806  MCH 30.2 09/08/2015 0903    MCHC 32.2 01/29/2018 0806    MCHC 33.2 09/08/2015 0903    RDW 13.5 01/29/2018 0806    LYMPHSABS 1.3 09/08/2015 0903    MONOABS 0.3 09/08/2015 0903    EOSABS 0.1 09/08/2015 0903    BASOSABS 0.0 09/08/2015 0903        CMP     Labs (Brief)          Component Value Date/Time    NA 142 01/29/2018 0806    K 4.4 01/29/2018 0806    CL 103 01/29/2018 0806    CO2 26 01/29/2018 0806    GLUCOSE 77 01/29/2018 0806    GLUCOSE 101 (H) 09/08/2015 0903    BUN 10 01/29/2018 0806    CREATININE 0.76 01/29/2018 0806    CALCIUM 9.1 01/29/2018 0806    PROT 7.0 01/29/2018 0806    ALBUMIN 4.4 01/29/2018 0806    AST 10 01/29/2018 0806    ALT 11 01/29/2018 0806    ALKPHOS 58 01/29/2018 0806    BILITOT 0.3 01/29/2018 0806    GFRNONAA 112 01/29/2018 0806    GFRAA 129 01/29/2018 0806          Assessment/Plan:    Nausea and vomiting, unspecified vomiting type Abdominal pain, epigastric Gastroesophageal reflux disease without esophagitis Weight loss  Suspect patient's weight loss in multi-factorial. Could be due to esophagitis, gastritis, PUD although last EGD was normal. Could be secondary to biliary dyskinsia since pain with eating (no HIDA scan but CT showed no gallstones). Also cannot r/o fearful eating/psych related disorder including ARFID. Difficult management since she doesn't tolerate medications - EGD for further evaluation - I thoroughly discussed the procedure with the patient (at bedside) to include nature of the procedure, alternatives, benefits, and risks (including but not limited to bleeding, infection, perforation, anesthesia/cardiac pulmonary complications).  Patient verbalized understanding and gave verbal consent to proceed with procedure.  - HIDA scan to r/o bliiary dyskinesia - unable to tolerate PPI and H2  blocker. Trial of carafate suspension 2-3 times daily taken 1 hour away from other medications. - if all of the above is negative, will defer to PCP or psych. If feeding tube is indicated, will need to refer to IR.   Iron deficiency Discussed getting iron infusions due to ferritin of 4 and patient declined. Patient is not tolerant to oral iron therapy     Lara Mulch [facility] 02/21/2023, 2:39 PM   Cc: Barbie Banner, MD

## 2023-02-25 NOTE — Patient Instructions (Signed)

## 2023-02-25 NOTE — Op Note (Signed)
[facility] Patient Name: Martha Wells Procedure Date: 02/25/2023 9:40 AM MRN: [id number] Endoscopist: Wilhemina Bonito. [name] , MD, [id number] Age: 27 Referring MD:  Date of Birth: 09-10-1995 Gender: Female Account #: [id number] Procedure:                Upper GI endoscopy with biopsies Indications:              Abdominal pain, Weight loss, iron deficiency                            anemia, patient reports gluten sensitivity and                            states she has been avoiding gluten for a few months Medicines:                Monitored Anesthesia Care Procedure:                Pre-Anesthesia Assessment:                           - Prior to the procedure, a History and Physical                            was performed, and patient medications and                            allergies were reviewed. The patient's tolerance of                            previous anesthesia was also reviewed. The risks                            and benefits of the procedure and the sedation                            options and risks were discussed with the patient.                            All questions were answered, and informed consent                            was obtained. Prior Anticoagulants: The patient has                            taken no anticoagulant or antiplatelet agents. ASA                            Grade Assessment: II - A patient with mild systemic                            disease. After reviewing the risks and benefits,                            the patient was deemed in satisfactory condition to  undergo the procedure.                           After obtaining informed consent, the endoscope was                            passed under direct vision. Throughout the                            procedure, the patient's blood pressure, pulse, and                            oxygen saturations were monitored continuously. The                             Olympus scope [id number] was introduced through the                            mouth, and advanced to the second part of duodenum.                            The upper GI endoscopy was accomplished without                            difficulty. The patient tolerated the procedure                            well. Scope In: Scope Out: Findings:                 The esophagus was normal.                           The stomach was normal.                           The examined duodenum was normal. Biopsies for                            histology were taken with a cold forceps for                            evaluation of celiac disease.                           The cardia and gastric fundus were normal on                            retroflexion. Complications:            No immediate complications. Estimated Blood Loss:     Estimated blood loss: none. Impression:               - Normal esophagus.                           - Normal stomach.                           -  Normal examined duodenum. Biopsied. Recommendation:           - Patient has a contact number available for                            emergencies. The signs and symptoms of potential                            delayed complications were discussed with the                            patient. Return to normal activities tomorrow.                            Written discharge instructions were provided to the                            patient.                           - Resume previous diet.                           - Continue present medications.                           - Await pathology results.                           - Keep plans for HIDA scan as scheduled                           - Tissue transglutaminase antibody IgA and serum                            IgA level today (blood work laboratory studies)                           - Pending the results of the above, consider CTA to                            rule out  celiac artery compression syndrome. Wilhemina Bonito. [name], MD 02/25/2023 10:16:59 AM This report has been signed electronically.

## 2023-02-25 NOTE — Progress Notes (Signed)
Called to room to assist during endoscopic procedure.  Patient ID and intended procedure confirmed with present staff. Received instructions for my participation in the procedure from the performing physician.  

## 2023-02-25 NOTE — Progress Notes (Signed)
A and O x3. Report to RN. Tolerated MAC anesthesia well.Teeth unchanged after procedure.

## 2023-02-26 ENCOUNTER — Telehealth: Payer: Self-pay

## 2023-02-26 NOTE — Telephone Encounter (Signed)
Attempted follow up call; husband answered and stated patient was still sleeping. Husband states wife is doing well after yesterday's procedures. RN communicated with husband to have his wife call if she has any questions or concerns.

## 2023-02-27 ENCOUNTER — Encounter: Payer: Self-pay | Admitting: Internal Medicine

## 2023-02-27 ENCOUNTER — Other Ambulatory Visit: Payer: Self-pay | Admitting: *Deleted

## 2023-02-27 DIAGNOSIS — R112 Nausea with vomiting, unspecified: Secondary | ICD-10-CM

## 2023-02-27 DIAGNOSIS — R1013 Epigastric pain: Secondary | ICD-10-CM

## 2023-03-03 ENCOUNTER — Ambulatory Visit (HOSPITAL_COMMUNITY)
Admission: RE | Admit: 2023-03-03 | Discharge: 2023-03-03 | Disposition: A | Discharge status: Home / Self Care | Payer: MEDICAID | Source: Ambulatory Visit | Attending: Gastroenterology | Admitting: Gastroenterology

## 2023-03-03 DIAGNOSIS — K219 Gastro-esophageal reflux disease without esophagitis: Secondary | ICD-10-CM | POA: Diagnosis present

## 2023-03-03 DIAGNOSIS — R1013 Epigastric pain: Secondary | ICD-10-CM | POA: Diagnosis present

## 2023-03-03 DIAGNOSIS — R112 Nausea with vomiting, unspecified: Secondary | ICD-10-CM | POA: Diagnosis present

## 2023-03-03 MED ORDER — MORPHINE SULFATE (PF) 2 MG/ML IV SOLN
INTRAVENOUS | Status: AC
  Filled 2023-03-03: qty 1

## 2023-03-03 MED ORDER — MORPHINE SULFATE (PF) 2 MG/ML IV SOLN
2.0000 mg | Freq: Once | INTRAVENOUS | Status: AC
  Administered 2023-03-03: 2 mg via INTRAVENOUS

## 2023-03-03 MED ORDER — MORPHINE BOLUS VIA INFUSION
2.0000 mg | Freq: Once | INTRAVENOUS | Status: DC
Start: 2023-03-03 — End: 2023-03-03

## 2023-03-03 MED ORDER — TECHNETIUM TC 99M MEBROFENIN IV KIT
5.2000 | PACK | Freq: Once | INTRAVENOUS | Status: AC
  Administered 2023-03-03: 5.2 via INTRAVENOUS

## 2023-03-04 ENCOUNTER — Other Ambulatory Visit: Payer: MEDICAID

## 2023-03-04 DIAGNOSIS — R112 Nausea with vomiting, unspecified: Secondary | ICD-10-CM

## 2023-03-04 DIAGNOSIS — R1013 Epigastric pain: Secondary | ICD-10-CM

## 2023-03-06 ENCOUNTER — Telehealth: Payer: Self-pay | Admitting: Gastroenterology

## 2023-03-06 ENCOUNTER — Other Ambulatory Visit: Payer: Self-pay

## 2023-03-06 DIAGNOSIS — K909 Intestinal malabsorption, unspecified: Secondary | ICD-10-CM

## 2023-03-06 DIAGNOSIS — D508 Other iron deficiency anemias: Secondary | ICD-10-CM

## 2023-03-06 NOTE — Telephone Encounter (Signed)
Inbound call from patient's mother requesting a call back regarding recent lab and imaging results. Advised they have not yet been reviewed. Stated patient is still in a lot of pain and worried about results. Please advise, thank you.

## 2023-03-06 NOTE — Telephone Encounter (Signed)
Pt calling for results, please advise

## 2023-03-13 ENCOUNTER — Other Ambulatory Visit: Payer: Self-pay

## 2023-03-13 DIAGNOSIS — R112 Nausea with vomiting, unspecified: Secondary | ICD-10-CM

## 2023-03-13 DIAGNOSIS — R1013 Epigastric pain: Secondary | ICD-10-CM

## 2023-03-16 ENCOUNTER — Ambulatory Visit (HOSPITAL_COMMUNITY)
Admission: RE | Admit: 2023-03-16 | Discharge: 2023-03-16 | Disposition: A | Discharge status: Home / Self Care | Payer: MEDICAID | Source: Ambulatory Visit | Attending: Gastroenterology | Admitting: Gastroenterology

## 2023-03-16 DIAGNOSIS — R1013 Epigastric pain: Secondary | ICD-10-CM | POA: Diagnosis present

## 2023-03-16 DIAGNOSIS — R112 Nausea with vomiting, unspecified: Secondary | ICD-10-CM | POA: Insufficient documentation

## 2023-03-20 NOTE — Telephone Encounter (Signed)
Patients mother called to find out what the next treatment plan is. States the patient is in a lot of abdominal pain, seeking a allergy test to rule out diary products. Is really concerned about her weight loss and eating habits.

## 2023-03-21 ENCOUNTER — Other Ambulatory Visit: Payer: Self-pay

## 2023-03-21 ENCOUNTER — Telehealth: Payer: Self-pay

## 2023-03-21 DIAGNOSIS — R112 Nausea with vomiting, unspecified: Secondary | ICD-10-CM

## 2023-03-21 DIAGNOSIS — R634 Abnormal weight loss: Secondary | ICD-10-CM

## 2023-03-21 DIAGNOSIS — R1013 Epigastric pain: Secondary | ICD-10-CM

## 2023-03-21 NOTE — Telephone Encounter (Signed)
Called patient's mother and left voicemail to inform her that we have put a order in for a CTA and to please give radiology a call to get it scheduled since I know they can have a busy schedule

## 2023-03-23 ENCOUNTER — Emergency Department (HOSPITAL_COMMUNITY): Payer: MEDICAID

## 2023-03-23 ENCOUNTER — Inpatient Hospital Stay (HOSPITAL_COMMUNITY)
Admission: EM | Admit: 2023-03-23 | Discharge: 2023-03-29 | DRG: 392 | Disposition: A | Discharge status: Home / Self Care | Payer: MEDICAID | Attending: Internal Medicine | Admitting: Internal Medicine

## 2023-03-23 ENCOUNTER — Other Ambulatory Visit: Payer: Self-pay

## 2023-03-23 ENCOUNTER — Encounter (HOSPITAL_COMMUNITY): Payer: Self-pay

## 2023-03-23 DIAGNOSIS — Z8249 Family history of ischemic heart disease and other diseases of the circulatory system: Secondary | ICD-10-CM | POA: Diagnosis not present

## 2023-03-23 DIAGNOSIS — Z833 Family history of diabetes mellitus: Secondary | ICD-10-CM

## 2023-03-23 DIAGNOSIS — G8929 Other chronic pain: Secondary | ICD-10-CM | POA: Diagnosis present

## 2023-03-23 DIAGNOSIS — E162 Hypoglycemia, unspecified: Secondary | ICD-10-CM | POA: Diagnosis present

## 2023-03-23 DIAGNOSIS — Z820 Family history of epilepsy and other diseases of the nervous system: Secondary | ICD-10-CM

## 2023-03-23 DIAGNOSIS — G40209 Localization-related (focal) (partial) symptomatic epilepsy and epileptic syndromes with complex partial seizures, not intractable, without status epilepticus: Secondary | ICD-10-CM | POA: Diagnosis present

## 2023-03-23 DIAGNOSIS — R6881 Early satiety: Secondary | ICD-10-CM | POA: Diagnosis present

## 2023-03-23 DIAGNOSIS — K30 Functional dyspepsia: Secondary | ICD-10-CM | POA: Diagnosis present

## 2023-03-23 DIAGNOSIS — R634 Abnormal weight loss: Secondary | ICD-10-CM

## 2023-03-23 DIAGNOSIS — Z79899 Other long term (current) drug therapy: Secondary | ICD-10-CM | POA: Diagnosis not present

## 2023-03-23 DIAGNOSIS — Z8673 Personal history of transient ischemic attack (TIA), and cerebral infarction without residual deficits: Secondary | ICD-10-CM | POA: Diagnosis not present

## 2023-03-23 DIAGNOSIS — K900 Celiac disease: Secondary | ICD-10-CM | POA: Diagnosis present

## 2023-03-23 DIAGNOSIS — R109 Unspecified abdominal pain: Secondary | ICD-10-CM | POA: Diagnosis not present

## 2023-03-23 DIAGNOSIS — I959 Hypotension, unspecified: Secondary | ICD-10-CM | POA: Diagnosis present

## 2023-03-23 DIAGNOSIS — F419 Anxiety disorder, unspecified: Secondary | ICD-10-CM | POA: Diagnosis present

## 2023-03-23 DIAGNOSIS — R64 Cachexia: Secondary | ICD-10-CM | POA: Diagnosis present

## 2023-03-23 DIAGNOSIS — R1013 Epigastric pain: Secondary | ICD-10-CM

## 2023-03-23 DIAGNOSIS — D509 Iron deficiency anemia, unspecified: Secondary | ICD-10-CM | POA: Diagnosis present

## 2023-03-23 DIAGNOSIS — Z681 Body mass index (BMI) 19 or less, adult: Secondary | ICD-10-CM | POA: Diagnosis not present

## 2023-03-23 DIAGNOSIS — R110 Nausea: Secondary | ICD-10-CM

## 2023-03-23 DIAGNOSIS — E43 Unspecified severe protein-calorie malnutrition: Secondary | ICD-10-CM | POA: Insufficient documentation

## 2023-03-23 DIAGNOSIS — E860 Dehydration: Secondary | ICD-10-CM | POA: Diagnosis present

## 2023-03-23 DIAGNOSIS — R627 Adult failure to thrive: Secondary | ICD-10-CM | POA: Diagnosis present

## 2023-03-23 DIAGNOSIS — K551 Chronic vascular disorders of intestine: Secondary | ICD-10-CM | POA: Diagnosis present

## 2023-03-23 LAB — CBC WITH DIFFERENTIAL/PLATELET
Abs Immature Granulocytes: 0.01 10*3/uL (ref 0.00–0.07)
Basophils Absolute: 0 10*3/uL (ref 0.0–0.1)
Basophils Relative: 1 %
Eosinophils Absolute: 0.1 10*3/uL (ref 0.0–0.5)
Eosinophils Relative: 1 %
HCT: 37.3 % (ref 36.0–46.0)
Hemoglobin: 11.6 g/dL — ABNORMAL LOW (ref 12.0–15.0)
Immature Granulocytes: 0 %
Lymphocytes Relative: 27 %
Lymphs Abs: 1.5 10*3/uL (ref 0.7–4.0)
MCH: 28.1 pg (ref 26.0–34.0)
MCHC: 31.1 g/dL (ref 30.0–36.0)
MCV: 90.3 fL (ref 80.0–100.0)
Monocytes Absolute: 0.5 10*3/uL (ref 0.1–1.0)
Monocytes Relative: 9 %
Neutro Abs: 3.6 10*3/uL (ref 1.7–7.7)
Neutrophils Relative %: 62 %
Platelets: 285 10*3/uL (ref 150–400)
RBC: 4.13 MIL/uL (ref 3.87–5.11)
RDW: 15.5 % (ref 11.5–15.5)
WBC: 5.7 10*3/uL (ref 4.0–10.5)
nRBC: 0 % (ref 0.0–0.2)

## 2023-03-23 LAB — COMPREHENSIVE METABOLIC PANEL
ALT: 16 U/L (ref 0–44)
AST: 15 U/L (ref 15–41)
Albumin: 3.8 g/dL (ref 3.5–5.0)
Alkaline Phosphatase: 55 U/L (ref 38–126)
Anion gap: 6 (ref 5–15)
BUN: 7 mg/dL (ref 6–20)
CO2: 27 mmol/L (ref 22–32)
Calcium: 8.9 mg/dL (ref 8.9–10.3)
Chloride: 103 mmol/L (ref 98–111)
Creatinine, Ser: 0.77 mg/dL (ref 0.44–1.00)
GFR, Estimated: >60 mL/min (ref >60)
Glucose, Bld: 82 mg/dL (ref 70–99)
Potassium: 3.9 mmol/L (ref 3.5–5.1)
Sodium: 136 mmol/L (ref 135–145)
Total Bilirubin: 0.6 mg/dL (ref 0.3–1.2)
Total Protein: 7.3 g/dL (ref 6.5–8.1)

## 2023-03-23 LAB — URINALYSIS, ROUTINE W REFLEX MICROSCOPIC
Bilirubin Urine: NEGATIVE
Glucose, UA: NEGATIVE mg/dL
Hgb urine dipstick: NEGATIVE
Ketones, ur: NEGATIVE mg/dL
Leukocytes,Ua: NEGATIVE
Nitrite: NEGATIVE
Protein, ur: NEGATIVE mg/dL
Specific Gravity, Urine: 1.011 (ref 1.005–1.030)
pH: 8 (ref 5.0–8.0)

## 2023-03-23 LAB — LIPASE, BLOOD: Lipase: 36 U/L (ref 11–51)

## 2023-03-23 LAB — HCG, SERUM, QUALITATIVE: Preg, Serum: NEGATIVE

## 2023-03-23 MED ORDER — NORGESTIM-ETH ESTRAD TRIPHASIC 0.18/0.215/0.25 MG-35 MCG PO TABS
1.0000 | ORAL_TABLET | Freq: Every day | ORAL | Status: DC
  Administered 2023-03-23 – 2023-03-28 (×6): 1 via ORAL

## 2023-03-23 MED ORDER — SODIUM CHLORIDE 0.9% FLUSH
3.0000 mL | Freq: Two times a day (BID) | INTRAVENOUS | Status: DC
  Administered 2023-03-23 – 2023-03-29 (×8): 3 mL via INTRAVENOUS

## 2023-03-23 MED ORDER — LACTATED RINGERS IV SOLN: INTRAVENOUS | Status: AC

## 2023-03-23 MED ORDER — ACETAMINOPHEN 325 MG PO TABS
650.0000 mg | ORAL_TABLET | Freq: Four times a day (QID) | ORAL | Status: DC | PRN
  Administered 2023-03-24 – 2023-03-26 (×3): 650 mg via ORAL
  Filled 2023-03-23 (×3): qty 2

## 2023-03-23 MED ORDER — SODIUM CHLORIDE 0.9 % IV BOLUS
1000.0000 mL | Freq: Once | INTRAVENOUS | Status: AC
  Administered 2023-03-23: 1000 mL via INTRAVENOUS

## 2023-03-23 MED ORDER — MELATONIN 3 MG PO TABS
3.0000 mg | ORAL_TABLET | Freq: Every evening | ORAL | Status: DC | PRN
  Administered 2023-03-23: 3 mg via ORAL
  Filled 2023-03-23: qty 1

## 2023-03-23 MED ORDER — IOHEXOL 350 MG/ML SOLN
100.0000 mL | Freq: Once | INTRAVENOUS | Status: AC | PRN
  Administered 2023-03-23: 100 mL via INTRAVENOUS

## 2023-03-23 MED ORDER — ONDANSETRON HCL 4 MG/2ML IJ SOLN
4.0000 mg | Freq: Four times a day (QID) | INTRAMUSCULAR | Status: DC | PRN
  Administered 2023-03-25 – 2023-03-27 (×2): 4 mg via INTRAVENOUS
  Filled 2023-03-23 (×2): qty 2

## 2023-03-23 MED ORDER — ONDANSETRON HCL 4 MG PO TABS
4.0000 mg | ORAL_TABLET | Freq: Four times a day (QID) | ORAL | Status: DC | PRN
  Filled 2023-03-23: qty 1

## 2023-03-23 MED ORDER — LAMOTRIGINE ER 100 MG PO TB24
200.0000 mg | ORAL_TABLET | Freq: Two times a day (BID) | ORAL | Status: DC
  Administered 2023-03-24 – 2023-03-29 (×11): 200 mg via ORAL
  Filled 2023-03-23 (×12): qty 2

## 2023-03-23 MED ORDER — SENNOSIDES-DOCUSATE SODIUM 8.6-50 MG PO TABS: 1.0000 | ORAL_TABLET | Freq: Every evening | ORAL | Status: DC | PRN

## 2023-03-23 MED ORDER — ACETAMINOPHEN 650 MG RE SUPP: 650.0000 mg | Freq: Four times a day (QID) | RECTAL | Status: DC | PRN

## 2023-03-23 NOTE — ED Triage Notes (Signed)
Pt arrived via POV with parents. Ongoing abd pain, nausea. Has had issues losing weight. Has not been able to tolerate PO's as normal due to pain and nausea. Has been seen previously for same with no clear dx. Pt and family also stating intermittent left sided numbness that occurs before seizures, not currently present in triage but follows with neurologist.

## 2023-03-23 NOTE — ED Notes (Signed)
.  wld

## 2023-03-23 NOTE — Hospital Course (Signed)
Martha Wells is a 27 y.o. 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  Gastroenterology to consult.

## 2023-03-23 NOTE — ED Notes (Signed)
ED TO INPATIENT HANDOFF REPORT  Name/Age/Gender Martha Wells 27 y.o. female  Code Status    Code Status Orders  (From admission, onward)           Start     Ordered   03/23/23 2105  Full code  Continuous       Question:  By:  Answer:  Consent: discussion documented in EHR   03/23/23 2105           Code Status History     This patient has a current code status but no historical code status.       Home/SNF/Other Home  Chief Complaint Postprandial epigastric pain [R10.13]  Level of Care/Admitting Diagnosis ED Disposition     ED Disposition  Admit   Condition  --   Comment  H[facility] [[id number]]  Level of Care: Med-Surg [16]  May admit patient to [facility] or [facility] if equivalent level of care is available:: No  Covid Evaluation: Asymptomatic - no recent exposure (last 10 days) testing not required  Diagnosis: Postprandial epigastric pain [653452]  Admitting Physician: Charlsie Quest [[id number]]  Attending Physician: Charlsie Quest [[id number]]  Certification:: I certify this patient will need inpatient services for at least 2 midnights  Expected Medical Readiness: 03/25/2023          Medical History Past Medical History:  Diagnosis Date   Avoidant or restrictive food intake disorder    Hx of foot surgery 2008   reconstruction- right    Seizures (HCC)    17 weeks old    Allergies No Known Allergies  IV Location/Drains/Wounds Patient Lines/Drains/Airways Status     Active Line/Drains/Airways     Name Placement date Placement time Site Days   Peripheral IV 03/23/23 20 G Right Antecubital 03/23/23  1632  Antecubital  less than 1            Labs/Imaging Results for orders placed or performed during the hospital encounter of 03/23/23 (from the past 48 hour(s))  Urinalysis, Routine w reflex microscopic -Urine, Clean Catch     Status: Abnormal   Collection Time: 03/23/23  3:53 PM  Result  Value Ref Range   Color, Urine STRAW (A) YELLOW   APPearance CLEAR CLEAR   Specific Gravity, Urine 1.011 1.005 - 1.030   pH 8.0 5.0 - 8.0   Glucose, UA NEGATIVE NEGATIVE mg/dL   Hgb urine dipstick NEGATIVE NEGATIVE   Bilirubin Urine NEGATIVE NEGATIVE   Ketones, ur NEGATIVE NEGATIVE mg/dL   Protein, ur NEGATIVE NEGATIVE mg/dL   Nitrite NEGATIVE NEGATIVE   Leukocytes,Ua NEGATIVE NEGATIVE    Comment: Performed at [facility], [address] [place], [place] [address]  Comprehensive metabolic panel     Status: None   Collection Time: 03/23/23  4:32 PM  Result Value Ref Range   Sodium 136 135 - 145 mmol/L   Potassium 3.9 3.5 - 5.1 mmol/L   Chloride 103 98 - 111 mmol/L   CO2 27 22 - 32 mmol/L   Glucose, Bld 82 70 - 99 mg/dL    Comment: Glucose reference range applies only to samples taken after fasting for at least 8 hours.   BUN 7 6 - 20 mg/dL   Creatinine, Ser 5.63 0.44 - 1.00 mg/dL   Calcium 8.9 8.9 - 87.5 mg/dL   Total Protein 7.3 6.5 - 8.1 g/dL   Albumin 3.8 3.5 - 5.0 g/dL   AST 15 15 - 41  U/L   ALT 16 0 - 44 U/L   Alkaline Phosphatase 55 38 - 126 U/L   Total Bilirubin 0.6 0.3 - 1.2 mg/dL   GFR, Estimated >16 >10 mL/min    Comment: (NOTE) Calculated using the CKD-EPI Creatinine Equation (2021)    Anion gap 6 5 - 15    Comment: Performed at [facility], [address] [place], [place] [address]  Lipase, blood     Status: None   Collection Time: 03/23/23  4:32 PM  Result Value Ref Range   Lipase 36 11 - 51 U/L    Comment: Performed at [facility], [address], [place], [place] [address]  CBC with Diff     Status: Abnormal   Collection Time: 03/23/23  4:32 PM  Result Value Ref Range   WBC 5.7 4.0 - 10.5 K/uL   RBC 4.13 3.87 - 5.11 MIL/uL   Hemoglobin 11.6 (L) 12.0 - 15.0 g/dL   HCT 19.1 47.8 - 29.5 %   MCV 90.3 80.0 - 100.0 fL   MCH 28.1 26.0 - 34.0 pg   MCHC 31.1 30.0 - 36.0 g/dL   RDW 62.1 30.8 - 65.7 %    Platelets 285 150 - 400 K/uL   nRBC 0.0 0.0 - 0.2 %   Neutrophils Relative % 62 %   Neutro Abs 3.6 1.7 - 7.7 K/uL   Lymphocytes Relative 27 %   Lymphs Abs 1.5 0.7 - 4.0 K/uL   Monocytes Relative 9 %   Monocytes Absolute 0.5 0.1 - 1.0 K/uL   Eosinophils Relative 1 %   Eosinophils Absolute 0.1 0.0 - 0.5 K/uL   Basophils Relative 1 %   Basophils Absolute 0.0 0.0 - 0.1 K/uL   Immature Granulocytes 0 %   Abs Immature Granulocytes 0.01 0.00 - 0.07 K/uL    Comment: Performed at [facility], [address], [place], [place] [address]  hCG, serum, qualitative     Status: None   Collection Time: 03/23/23  4:32 PM  Result Value Ref Range   Preg, Serum NEGATIVE NEGATIVE    Comment:        THE SENSITIVITY OF THIS METHODOLOGY IS >10 mIU/mL. Performed at [facility], [address], [place], [place] [address]    CT Angio Abd/Pel W and/or Wo Contrast  Result Date: 03/23/2023 CLINICAL DATA:  Evaluate for mesenteric ischemia. Abdominal pain and nausea. EXAM: CTA ABDOMEN AND PELVIS WITHOUT AND WITH CONTRAST TECHNIQUE: Multidetector CT imaging of the abdomen and pelvis was performed using the standard protocol during bolus administration of intravenous contrast. Multiplanar reconstructed images and MIPs were obtained and reviewed to evaluate the vascular anatomy. RADIATION DOSE REDUCTION: This exam was performed according to the departmental dose-optimization program which includes automated exposure control, adjustment of the mA and/or kV according to patient size and/or use of iterative reconstruction technique. CONTRAST:  OMNIPAQUE IOHEXOL 350 MG/ML SOLN COMPARISON:  CT abdomen and pelvis 01/15/2022 FINDINGS: VASCULAR Aorta: Normal caliber aorta without aneurysm, dissection, vasculitis or significant stenosis. Celiac: Patent without evidence of aneurysm, dissection, vasculitis or significant stenosis. SMA: Patent without evidence of aneurysm, dissection,  vasculitis or significant stenosis. Superior mesenteric artery distance is decreased measuring 4.6 mm. Angle is not well measured secondary to positioning of the artery. Renals: Both renal arteries are patent without evidence of aneurysm, dissection, vasculitis, fibromuscular dysplasia or significant stenosis. IMA: Patent without evidence of aneurysm, dissection, vasculitis or significant stenosis. Inflow: Patent without evidence  of aneurysm, dissection, vasculitis or significant stenosis. Proximal Outflow: Bilateral common femoral and visualized portions of the superficial and profunda femoral arteries are patent without evidence of aneurysm, dissection, vasculitis or significant stenosis. Veins: No acute abnormality. Review of the MIP images confirms the above findings. NON-VASCULAR Lower chest: No acute abnormality. Hepatobiliary: No focal liver abnormality is seen. No gallstones, gallbladder wall thickening, or biliary dilatation. Pancreas: Unremarkable. No pancreatic ductal dilatation or surrounding inflammatory changes. Spleen: Normal in size without focal abnormality. Adrenals/Urinary Tract: Adrenal glands are unremarkable. Kidneys are normal, without renal calculi, focal lesion, or hydronephrosis. Bladder is unremarkable. Stomach/Bowel: Stomach is within normal limits. Appendix is not seen. No evidence of bowel wall thickening, distention, or inflammatory changes. There is a large amount of stool throughout the colon. Lymphatic: No enlarged lymph nodes. Reproductive: Uterus and bilateral adnexa are unremarkable. Other: No abdominal wall hernia or abnormality. No abdominopelvic ascites. Musculoskeletal: No acute or significant osseous findings. IMPRESSION: 1. No evidence for mesenteric ischemia. 2. Decreased superior mesenteric artery distance measuring 4.6 mm. 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 NON-VASCULAR 1. No acute localizing process in the abdomen or pelvis. 2. Large  amount of stool throughout the colon. Electronically Signed   By: Darliss Cheney M.D.   On: 03/23/2023 19:11    Pending Labs Unresulted Labs (From admission, onward)     Start     Ordered   03/24/23 0500  HIV Antibody (routine testing w rflx)  (HIV Antibody (Routine testing w reflex) panel)  Tomorrow morning,   R        03/23/23 2105   03/24/23 0500  CBC  Tomorrow morning,   R        03/23/23 2105   03/24/23 0500  Basic metabolic panel  Tomorrow morning,   R        03/23/23 2105            Vitals/Pain Today's Vitals   03/23/23 1409 03/23/23 1620 03/23/23 1824 03/23/23 1952  BP: 93/61 (!) 89/61 96/69 102/74  Pulse: 74 62 65 65  Resp: 18 16 16 18   Temp: 98.3 F (36.8 C)  98.3 F (36.8 C) (!) 97.4 F (36.3 C)  TempSrc: Oral  Oral Oral  SpO2: 98% 100% 99% 96%  Weight: 35.4 kg     Height: 5\' 2"  (1.575 m)       Isolation Precautions No active isolations  Medications Medications  sodium chloride flush (NS) 0.9 % injection 3 mL (has no administration in time range)  lactated ringers infusion (has no administration in time range)  acetaminophen (TYLENOL) tablet 650 mg (has no administration in time range)    Or  acetaminophen (TYLENOL) suppository 650 mg (has no administration in time range)  ondansetron (ZOFRAN) tablet 4 mg (has no administration in time range)    Or  ondansetron (ZOFRAN) injection 4 mg (has no administration in time range)  senna-docusate (Senokot-S) tablet 1 tablet (has no administration in time range)  melatonin tablet 3 mg (has no administration in time range)  sodium chloride 0.9 % bolus 1,000 mL (1,000 mLs Intravenous New Bag/Given 03/23/23 1640)  iohexol (OMNIPAQUE) 350 MG/ML injection 100 mL (100 mLs Intravenous Contrast Given 03/23/23 1804)    Mobility manual wheelchair

## 2023-03-23 NOTE — H&P (Signed)
History and Physical    Martha Wells ZOX:[id number] DOB: July 05, 1996 DOA: 03/23/2023  PCP: Barbie Banner, MD  Patient coming from: Home  I have personally briefly reviewed patient's old medical records in [facility]  Chief Complaint: Abdominal pain, nausea, weight loss  HPI: Martha Wells is a 27 y.o. 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  Patient has been dealing with difficulty keeping up with oral nutrition due to chronic nausea and postprandial epigastric pain.  She has been experiencing progressive weight loss.  This has been going on for years but worsened recently.  She has been getting lightheaded and dizzy easily.  She has been fatigued.  She reports feeling numbness and tingling sensation throughout her limbs.  She states that she becomes nauseous easily but has not vomiting.  She states that she does not have any diarrhea but will get constipated occasionally.  Last bowel movement was yesterday.  She follows with [facility] Dr. [name].  Recent HIDA scan and RUQ ultrasound were unrevealing.  She was recently diagnosed with celiac's disease by biopsy at elevated tissue transglutaminase antibody IgA levels.  ED Course  Labs/Imaging on admission: I have personally reviewed following labs and imaging studies.  Initial vitals showed BP 93/61, pulse 74, RR 18, temp 98.3 F, SpO2 98% on room air.  Labs show sodium 136, potassium 3.9, bicarb 27, BUN 7, creatinine 0.77, serum glucose 82, albumin 3.8, LFTs within normal limits, lipase 36, WBC 5.7, hemoglobin 11.6, platelets 285,000.  Serum hCG negative.  UA negative for UTI.  CTA abdomen/pelvis is negative for evidence of mesenteric ischemia.   Decreased SMA distance measuring 4.6 mm noted.  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  No acute localizing process in the abdomen or pelvis noted.  Large amount of stool seen throughout the colon.  Patient was given 1 L normal saline.  EDP discussed with [facility] Dr. Lavon Paganini who recommended medical admission and they will see in consultation.  The hospitalist service was consulted to admit for further evaluation and management.  Review of Systems: All systems reviewed and are negative except as documented in history of present illness above.   Past Medical History:  Diagnosis Date   Avoidant or restrictive food intake disorder    Hx of foot surgery 2008   reconstruction- right    Seizures (HCC)    36 weeks old    Past Surgical History:  Procedure Laterality Date   FOOT SURGERY Right 2008   reconstruction   TYMPANOSTOMY TUBE PLACEMENT      Social History:  reports that she has never smoked. She has never used smokeless tobacco. She reports that she does not drink alcohol and does not use drugs.  No Known Allergies  Family History  Problem Relation Age of Onset   Diabetes Mother    Multiple sclerosis Mother    Breast cancer Maternal Grandmother    Heart disease Maternal Grandfather    Breast cancer Paternal Grandmother    Seizures Neg Hx    Colon cancer Neg Hx    Stomach cancer Neg Hx    Esophageal cancer Neg Hx    Pancreatic cancer Neg Hx    Liver disease Neg Hx      Prior  to Admission medications   Medication Sig Start Date End Date Taking? Authorizing Provider  B Complex Vitamins (VITAMIN B COMPLEX PO) Take 1 tablet by mouth as needed. gummy   Yes [provider]  Iron Carbonyl-Vitamin C-FOS (CHEWABLE IRON PO) Take 1 tablet by mouth daily.   Yes [provider]  LamoTRIgine 200 MG TB24 24 hour tablet Take 1 tablet (200 mg total) by mouth 2 (two) times daily. 01/22/23  Yes [name], MD  Norgestim-Eth Charlott Holler  Triphasic (TRI-SPRINTEC PO) Take 1 tablet by mouth daily with supper.   Yes [provider]  ondansetron (ZOFRAN-ODT) 4 MG disintegrating tablet Take 4 mg by mouth 3 (three) times daily as needed. 04/29/22  Yes [provider]  Misc. Devices [facility]) MISC 1 rolling walker for home use 01/16/22   Garlon Hatchet, PA-C  sucralfate (CARAFATE) 1 GM/10ML suspension Take 10 mLs (1 g total) by mouth 2 (two) times daily. Patient not taking: Reported on 02/25/2023 02/21/23   Legrand Como, PA-C    Physical Exam: Vitals:   03/23/23 1409 03/23/23 1620 03/23/23 1824 03/23/23 1952  BP: 93/61 (!) 89/61 96/69 102/74  Pulse: 74 62 65 65  Resp: 18 16 16 18   Temp: 98.3 F (36.8 C)  98.3 F (36.8 C) (!) 97.4 F (36.3 C)  TempSrc: Oral  Oral Oral  SpO2: 98% 100% 99% 96%  Weight: 35.4 kg     Height: 5\' 2"  (1.575 m)      Constitutional: Thin woman resting in bed with head elevated, NAD, calm, comfortable Eyes: EOMI, lids and conjunctivae normal ENMT: Mucous membranes are moist. Posterior pharynx clear of any exudate or lesions.Normal dentition.  Neck: normal, supple, no masses. Respiratory: clear to auscultation bilaterally, no wheezing, no crackles. Normal respiratory effort. No accessory muscle use.  Cardiovascular: Regular rate and rhythm, no murmurs / rubs / gallops. No extremity edema. 2+ pedal pulses. Abdomen: Epigastric tenderness, no masses palpated. Musculoskeletal: no clubbing / cyanosis. No joint deformity upper and lower extremities. Good ROM, no contractures. Normal muscle tone.  Skin: no rashes, lesions, ulcers. No induration Neurologic: Sensation intact. Strength 5/5 in all 4.  Psychiatric: Alert and oriented x 3. Normal mood.   EKG: Not performed.  Assessment/Plan Principal Problem:   Postprandial epigastric pain Active Problems:   Partial symptomatic epilepsy with complex partial seizures, not intractable, without status epilepticus (HCC)    Iron deficiency anemia   Martha Wells is a 27 y.o. 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  Gastroenterology to consult.  Assessment and Plan: Postprandial abdominal pain with persistent nausea and progressive weight loss: Ongoing for years but worsened recently.  Recently diagnosed with celiac's disease.  CTA A/P shows changes which may suggest SMA syndrome.  Labs are stable.  EDP discussed case with gastroenterology who will see in consultation.  May need feeding tube. -[facility] to follow -Gluten-free diet -Continue IV fluid hydration overnight  Seizure disorder: Continue lamotrigine.  Iron deficiency anemia: Hemoglobin stable.  Has not tolerated oral iron supplementation.  She has been referred to hematology for IV iron infusions.   DVT prophylaxis: SCDs Start: 03/23/23 2105 Code Status: Full code Family Communication: Parents at bedside Disposition Plan: From home and likely return home pending GI evaluation and recommendations Consults called:  GI  Severity of Illness: The appropriate patient status for this patient is INPATIENT. Inpatient status is judged to be reasonable and necessary in order to provide the required intensity of service to ensure the patient's safety. The patient's presenting symptoms, physical exam findings, and initial radiographic and laboratory data in the context of their chronic comorbidities is felt to place them at high risk for further clinical deterioration. Furthermore, it is not anticipated that the patient will be medically stable for discharge from the hospital within 2 midnights of admission.   * I certify that at the point of admission it is my clinical judgment that the patient will require  inpatient hospital care spanning beyond 2 midnights from the point of admission due to high intensity of service, high risk for further deterioration and high frequency of surveillance required.Darreld Mclean MD [facility]  If 7PM-7AM, please contact night-coverage [email or web address]  03/23/2023, 9:15 PM

## 2023-03-23 NOTE — ED Provider Notes (Signed)
EMERGENCY DEPARTMENT AT [facility] Provider Note   CSN: [id number] Arrival date & time: 03/23/23  1342     History  Chief Complaint  Patient presents with   Abdominal Pain    Martha Wells is a 27 y.o. female.  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 History provided by patient as well as her parents present at bedside. Patient has reportedly had post prandial pain for the past several years. She has been follow-up by [facility] for this and recently had a endoscopy with biopsies that diagnosed her with celiacs disease. Family states that she has gone from 105 lbs to 75 lbs in the last few months. She states that she does get hungry but does not want to eat because it is so painful. She notes a severe burning sensation after food. She has tried and failed numerous reflux medications. She notes she feels nauseous after eating but denies any vomiting. She states that for the past few days she has felt more weak than normal as well. The family is concerned that she is dehydrated. Family has been pushing for getting a feeding tube placed given her dramatic weight loss but state that they were referred to nutritionists a dieticians instead. Patient denies any vomiting or diarrhea. No fevers or chills.   The history is provided by the patient. No language interpreter was used.  Abdominal Pain Associated symptoms: nausea        Home Medications Prior to Admission medications   Medication Sig Start Date End Date Taking? Authorizing Provider  B Complex Vitamins (VITAMIN B COMPLEX PO) Take 1 tablet by mouth as needed. gummy   Yes [provider]  Iron Carbonyl-Vitamin C-FOS (CHEWABLE IRON PO) Take 1 tablet by mouth daily.   Yes [provider]  LamoTRIgine 200 MG TB24 24 hour tablet Take 1 tablet (200 mg total) by mouth 2 (two) times daily. 01/22/23  Yes [name], MD   Norgestim-Eth Charlott Holler Triphasic (TRI-SPRINTEC PO) Take 1 tablet by mouth daily with supper.   Yes [provider]  ondansetron (ZOFRAN-ODT) 4 MG disintegrating tablet Take 4 mg by mouth 3 (three) times daily as needed. 04/29/22  Yes [provider]  Misc. Devices [facility]) MISC 1 rolling walker for home use 01/16/22   Garlon Hatchet, PA-C  sucralfate (CARAFATE) 1 GM/10ML suspension Take 10 mLs (1 g total) by mouth 2 (two) times daily. Patient not taking: Reported on 02/25/2023 02/21/23   Legrand Como, PA-C      Allergies    Patient has no known allergies.    Review of Systems   Review of Systems  Gastrointestinal:  Positive for abdominal pain and nausea.  All other systems reviewed and are negative.   Physical Exam Updated Vital Signs BP (!) 89/61 (BP Location: Left Arm)   Pulse 62   Temp 98.3 F (36.8 C) (Oral)   Resp 16   Ht 5\' 2"  (1.575 m)   Wt 35.4 kg   LMP 02/14/2023   SpO2 100%   BMI 14.27 kg/m  Physical Exam Vitals and nursing note reviewed.  Constitutional:      General: She is not in acute distress.    Appearance: Normal appearance. She is normal weight. She is not ill-appearing, toxic-appearing or diaphoretic.     Comments: Cachectic appearing  HENT:     Head: Normocephalic and atraumatic.  Cardiovascular:     Rate  and Rhythm: Normal rate.  Pulmonary:     Effort: Pulmonary effort is normal. No respiratory distress.  Abdominal:     General: Abdomen is flat.     Palpations: Abdomen is soft.     Tenderness: There is abdominal tenderness.  Musculoskeletal:        General: Normal range of motion.     Cervical back: Normal range of motion.  Skin:    General: Skin is warm and dry.  Neurological:     General: No focal deficit present.     Mental Status: She is alert.  Psychiatric:        Mood and Affect: Mood normal.        Behavior: Behavior normal.     ED Results / Procedures / Treatments   Labs (all labs ordered  are listed, but only abnormal results are displayed) Labs Reviewed  CBC WITH DIFFERENTIAL/PLATELET - Abnormal; Notable for the following components:      Result Value   Hemoglobin 11.6 (*)    All other components within normal limits  URINALYSIS, ROUTINE W REFLEX MICROSCOPIC - Abnormal; Notable for the following components:   Color, Urine STRAW (*)    All other components within normal limits  COMPREHENSIVE METABOLIC PANEL  LIPASE, BLOOD  HCG, SERUM, QUALITATIVE    EKG None  Radiology CT Angio Abd/Pel W and/or Wo Contrast  Result Date: 03/23/2023 CLINICAL DATA:  Evaluate for mesenteric ischemia. Abdominal pain and nausea. EXAM: CTA ABDOMEN AND PELVIS WITHOUT AND WITH CONTRAST TECHNIQUE: Multidetector CT imaging of the abdomen and pelvis was performed using the standard protocol during bolus administration of intravenous contrast. Multiplanar reconstructed images and MIPs were obtained and reviewed to evaluate the vascular anatomy. RADIATION DOSE REDUCTION: This exam was performed according to the departmental dose-optimization program which includes automated exposure control, adjustment of the mA and/or kV according to patient size and/or use of iterative reconstruction technique. CONTRAST:  OMNIPAQUE IOHEXOL 350 MG/ML SOLN COMPARISON:  CT abdomen and pelvis 01/15/2022 FINDINGS: VASCULAR Aorta: Normal caliber aorta without aneurysm, dissection, vasculitis or significant stenosis. Celiac: Patent without evidence of aneurysm, dissection, vasculitis or significant stenosis. SMA: Patent without evidence of aneurysm, dissection, vasculitis or significant stenosis. Superior mesenteric artery distance is decreased measuring 4.6 mm. Angle is not well measured secondary to positioning of the artery. Renals: Both renal arteries are patent without evidence of aneurysm, dissection, vasculitis, fibromuscular dysplasia or significant stenosis. IMA: Patent without evidence of aneurysm, dissection,  vasculitis or significant stenosis. Inflow: Patent without evidence of aneurysm, dissection, vasculitis or significant stenosis. Proximal Outflow: Bilateral common femoral and visualized portions of the superficial and profunda femoral arteries are patent without evidence of aneurysm, dissection, vasculitis or significant stenosis. Veins: No acute abnormality. Review of the MIP images confirms the above findings. NON-VASCULAR Lower chest: No acute abnormality. Hepatobiliary: No focal liver abnormality is seen. No gallstones, gallbladder wall thickening, or biliary dilatation. Pancreas: Unremarkable. No pancreatic ductal dilatation or surrounding inflammatory changes. Spleen: Normal in size without focal abnormality. Adrenals/Urinary Tract: Adrenal glands are unremarkable. Kidneys are normal, without renal calculi, focal lesion, or hydronephrosis. Bladder is unremarkable. Stomach/Bowel: Stomach is within normal limits. Appendix is not seen. No evidence of bowel wall thickening, distention, or inflammatory changes. There is a large amount of stool throughout the colon. Lymphatic: No enlarged lymph nodes. Reproductive: Uterus and bilateral adnexa are unremarkable. Other: No abdominal wall hernia or abnormality. No abdominopelvic ascites. Musculoskeletal: No acute or significant osseous findings. IMPRESSION: 1. No evidence for mesenteric  ischemia. 2. Decreased superior mesenteric artery distance measuring 4.6 mm. 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 NON-VASCULAR 1. No acute localizing process in the abdomen or pelvis. 2. Large amount of stool throughout the colon. Electronically Signed   By: Darliss Cheney M.D.   On: 03/23/2023 19:11    Procedures Procedures    Medications Ordered in ED Medications  sodium chloride 0.9 % bolus 1,000 mL (1,000 mLs Intravenous New Bag/Given 03/23/23 1640)  iohexol (OMNIPAQUE) 350 MG/ML injection 100 mL (100 mLs Intravenous Contrast Given 03/23/23  1804)    ED Course/ Medical Decision Making/ A&P                                 Medical Decision Making Amount and/or Complexity of Data Reviewed Labs: ordered. Radiology: ordered.   This patient is a 27 y.o. female who presents to the ED for concern of abdominal pain, weight loss, nausea, this involves an extensive number of treatment options, and is a complaint that carries with it a high risk of complications and morbidity. The emergent differential diagnosis prior to evaluation includes, but is not limited to,  The differential diagnosis for generalized abdominal pain includes, but is not limited to AAA, gastroenteritis, appendicitis, Bowel obstruction, Bowel perforation. Gastroparesis, DKA, Hernia, Inflammatory bowel disease, mesenteric ischemia, pancreatitis, peritonitis SBP, volvulus.   This is not an exhaustive differential.   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 Pertinent results include: followed by [facility] Dr. [name], recent biopsy shows celiacs disease. Did have CT angio of abdomen and pelvis to rule out celiac axis compression syndrome. She has not had this imaging yet. Does note that when patient began following with GI she was 100 lbs, most recent 1 month ago she was 80 lbs, today she is 75 lbs  Physical Exam: Physical exam performed. The pertinent findings include: cachectic appearing, generalized abdominal ttp. No rebound or guarding  Lab Tests: I ordered, and personally interpreted labs.  The pertinent results include:  no acute laboratory abnormalities   Imaging Studies: I ordered imaging studies including CT angio abdomen pelvis. I independently visualized and interpreted imaging which showed   1. No evidence for mesenteric ischemia. 2. Decreased superior mesenteric artery distance measuring 4.6 mm. This can be seen in the setting of superior mesenteric artery  syndrome. 3. Large amount of stool throughout the colon.   I agree with the radiologist interpretation.   Medications: I ordered medication including fluids for dehydration. Reevaluation of the patient after these medicines showed that the patient improved. I have reviewed the patients home medicines and have made adjustments as needed.  Consultations Obtained: I requested consultation with the GI on call Dr. Lavon Paganini,  and discussed lab and imaging findings as well as pertinent plan - they recommend: admit to medicine, given patients significant weight loss, they recommend likely feeding tube, they will see the patient tomorrow   Disposition:  Patient presents today with abdominal pain after eating, early satiety, nausea, and significant weight loss..  She has been dealing with this for some time.  She was recently diagnosed with celiac's disease which could potentially be contributing to her symptoms.  CT imaging today did also show some concerns for superior mesenteric artery syndrome.  It does seem like this would explain her symptoms.  Given that she currently weighs 83  lbs (BMI 14) and per GI recommendations, patient will require admission with GI consultation for likely feeding tube to help prevent further weight loss. Discussed this with family, they are on board with this plan.   Discussed patient with hospitalist who agrees to admit.  I discussed this case with my attending physician Dr. Rodena Medin who cosigned this note including patient's presenting symptoms, physical exam, and planned diagnostics and interventions. Attending physician stated agreement with plan or made changes to plan which were implemented.    Final Clinical Impression(s) / ED Diagnoses Final diagnoses:  Failure to thrive in adult  Excessive weight loss  Early satiety  Epigastric pain  Nausea without vomiting    Rx / DC Orders ED Discharge Orders     None         Vear Clock 03/23/23 2119     Wynetta Fines, MD 03/24/23 [phone]

## 2023-03-24 ENCOUNTER — Telehealth: Payer: Self-pay

## 2023-03-24 DIAGNOSIS — R1013 Epigastric pain: Secondary | ICD-10-CM | POA: Diagnosis not present

## 2023-03-24 DIAGNOSIS — E43 Unspecified severe protein-calorie malnutrition: Secondary | ICD-10-CM | POA: Insufficient documentation

## 2023-03-24 DIAGNOSIS — K900 Celiac disease: Principal | ICD-10-CM

## 2023-03-24 DIAGNOSIS — R627 Adult failure to thrive: Secondary | ICD-10-CM

## 2023-03-24 LAB — BASIC METABOLIC PANEL
Anion gap: 6 (ref 5–15)
BUN: 9 mg/dL (ref 6–20)
CO2: 25 mmol/L (ref 22–32)
Calcium: 8.4 mg/dL — ABNORMAL LOW (ref 8.9–10.3)
Chloride: 106 mmol/L (ref 98–111)
Creatinine, Ser: 0.8 mg/dL (ref 0.44–1.00)
GFR, Estimated: >60 mL/min (ref >60)
Glucose, Bld: 79 mg/dL (ref 70–99)
Potassium: 3.8 mmol/L (ref 3.5–5.1)
Sodium: 137 mmol/L (ref 135–145)

## 2023-03-24 LAB — PHOSPHORUS: Phosphorus: 4.3 mg/dL (ref 2.5–4.6)

## 2023-03-24 LAB — CBC
HCT: 30 % — ABNORMAL LOW (ref 36.0–46.0)
Hemoglobin: 9.4 g/dL — ABNORMAL LOW (ref 12.0–15.0)
MCH: 28.4 pg (ref 26.0–34.0)
MCHC: 31.3 g/dL (ref 30.0–36.0)
MCV: 90.6 fL (ref 80.0–100.0)
Platelets: 235 10*3/uL (ref 150–400)
RBC: 3.31 MIL/uL — ABNORMAL LOW (ref 3.87–5.11)
RDW: 15.7 % — ABNORMAL HIGH (ref 11.5–15.5)
WBC: 6 10*3/uL (ref 4.0–10.5)
nRBC: 0 % (ref 0.0–0.2)

## 2023-03-24 LAB — GLUCOSE, CAPILLARY
Glucose-Capillary: 84 mg/dL (ref 70–99)
Glucose-Capillary: 49 mg/dL — ABNORMAL LOW (ref 70–99)
Glucose-Capillary: 174 mg/dL — ABNORMAL HIGH (ref 70–99)

## 2023-03-24 LAB — MAGNESIUM: Magnesium: 2 mg/dL (ref 1.7–2.4)

## 2023-03-24 LAB — HIV ANTIBODY (ROUTINE TESTING W REFLEX): HIV Screen 4th Generation wRfx: NONREACTIVE

## 2023-03-24 MED ORDER — SODIUM CHLORIDE 0.9 % IV BOLUS
1000.0000 mL | Freq: Once | INTRAVENOUS | Status: AC
  Administered 2023-03-24: 1000 mL via INTRAVENOUS

## 2023-03-24 MED ORDER — ADULT MULTIVITAMIN LIQUID CH
15.0000 mL | Freq: Every day | ORAL | Status: DC
  Filled 2023-03-24 (×2): qty 15

## 2023-03-24 MED ORDER — DEXTROSE IN LACTATED RINGERS 5 % IV SOLN: INTRAVENOUS | Status: DC

## 2023-03-24 MED ORDER — DEXTROSE 50 % IV SOLN
INTRAVENOUS | Status: AC
  Filled 2023-03-24: qty 50

## 2023-03-24 MED ORDER — LACTATED RINGERS IV SOLN: INTRAVENOUS | Status: DC

## 2023-03-24 MED ORDER — DEXTROSE 50 % IV SOLN
25.0000 g | INTRAVENOUS | Status: AC
  Administered 2023-03-24: 25 g via INTRAVENOUS

## 2023-03-24 MED ORDER — MELATONIN 3 MG PO TABS
3.0000 mg | ORAL_TABLET | Freq: Once | ORAL | Status: AC
  Administered 2023-03-24: 3 mg via ORAL
  Filled 2023-03-24: qty 1

## 2023-03-24 MED ORDER — LACTATED RINGERS IV BOLUS
250.0000 mL | Freq: Once | INTRAVENOUS | Status: AC
  Administered 2023-03-24: 250 mL via INTRAVENOUS

## 2023-03-24 MED ORDER — KATE FARMS STANDARD 1.4 PO LIQD
240.0000 mL | ORAL | Status: DC
  Filled 2023-03-24 (×3): qty 325

## 2023-03-24 NOTE — Plan of Care (Signed)
  Problem: Education: Goal: Knowledge of General Education information will improve Description: Including pain rating scale, medication(s)/side effects and non-pharmacologic comfort measures Outcome: Progressing   Problem: Health Behavior/Discharge Planning: Goal: Ability to manage health-related needs will improve Outcome: Progressing   Problem: Clinical Measurements: Goal: Ability to maintain clinical measurements within normal limits will improve Outcome: Progressing Goal: Will remain free from infection Outcome: Progressing Goal: Diagnostic test results will improve Outcome: Progressing Goal: Respiratory complications will improve Outcome: Progressing Goal: Cardiovascular complication will be avoided Outcome: Progressing   Problem: Activity: Goal: Risk for activity intolerance will decrease Outcome: Progressing   Problem: Nutrition: Goal: Adequate nutrition will be maintained Outcome: Progressing   Problem: Coping: Goal: Level of anxiety will decrease Outcome: Progressing   Problem: Elimination: Goal: Will not experience complications related to bowel motility Outcome: Progressing Goal: Will not experience complications related to urinary retention Outcome: Progressing   Problem: Pain Managment: Goal: General experience of comfort will improve Outcome: Progressing   Problem: Safety: Goal: Ability to remain free from injury will improve Outcome: Progressing   Problem: Skin Integrity: Goal: Risk for impaired skin integrity will decrease Outcome: Progressing  Pt A/Ox4 on RA. IVF running. C/o abdominal burning pain. PRN tylenol administered. PRN dose of melatonin administered & additional x1 dose given, pt having trouble sleeping r/t anxiety. Dad at bedside. Up x1 assist.

## 2023-03-24 NOTE — Progress Notes (Signed)
Hypoglycemic Event  CBG: 49  Treatment: D50 50 mL (25 gm)  Symptoms: Shaky, Hungry, and Nervous/irritable  Follow-up CBG: Time:1805 CBG Result:168, 811(9147)  Possible Reasons for Event: Inadequate meal intake  Comments/MD notified:Ghimire Lyndel Safe, MD    Peggye Ley

## 2023-03-24 NOTE — Progress Notes (Signed)
Transition of Care [facility]) - Inpatient Brief Assessment   Patient Details  Name: Martha Wells MRN: [id number] Date of Birth: 1995/10/27  Transition of Care [facility]) CM/SW Contact:    Larrie Kass, LCSW Phone Number: 03/24/2023, 11:40 AM   Clinical Narrative:  Transition of Care Department [facility]) has reviewed patient and no TOC needs have been identified at this time. We will continue to monitor patient advancement through interdisciplinary progression rounds. If new patient transition needs arise, please place a TOC consult.  Transition of Care Asessment: Insurance and Status: Insurance coverage has been reviewed Patient has primary care physician: Yes Home environment has been reviewed: yes Prior level of function:: independent mod, walker Prior/Current Home Services: No current home services Social Determinants of Health Reivew: SDOH reviewed no interventions necessary Readmission risk has been reviewed: Yes Transition of care needs: no transition of care needs at this time

## 2023-03-24 NOTE — Consult Note (Addendum)
Consultation  Referring Provider:  [facility]  Primary Care Physician:  Barbie Banner, MD Primary Gastroenterologist:  Dr. [name]       Reason for Consultation:  Failure to thrive   LOS: 1 day          HPI:   Martha Wells is a 27 y.o. 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    Last seen in office 02/21/2023.  Patient has multiple year history of pain with eating (epigastric).  Has had extensive workup including HIDA scan, EGD, CT abdomen pelvis with contrast, lab work.  Recent EGD 02/25/2023 showed biopsies consistent with celiac.  Serology also was consistent with celiac disease.  Patient has been on gluten-free diet for many months with no improvement with symptoms.  Patient continues to lose weight currently has a BMI of 14 as a result of pain with eating.  Has tried PPI, H2 blocker, dicyclomine, and Carafate with no improvement.   Patient's father states Thursday 8/15 patient had a seizure in which she could not feel the left side of her body.  This is typical for her with her previous history of seizures.  She then continued to have severe pain with eating to the point where she felt she needed to go to the emergency department.  Patient was planned for CT angio to evaluate for SMA as an outpatient.  Upon admission she underwent CT angio abdomen pelvis with contrast.  This showed no evidence for mesenteric ischemia.  Showed decreased superior mesenteric artery distance measuring 4.6 mm seen in the setting of SMA syndrome.  Also showed large amount of stool throughout the colon.  Lab work Hgb 9.4, MCV 90.6 Normal sodium, potassium Normal kidney function  Vitals show hypotension with BP 83/60.  Pulse 65.  Afebrile.  Patient has been given 1L of normal saline  Past Medical History:  Diagnosis Date  . Avoidant or restrictive food intake disorder   . Hx of foot  surgery 2008   reconstruction- right   . Seizures (HCC)    69 weeks old    Surgical History:  She  has a past surgical history that includes Foot surgery (Right, 2008) and Tympanostomy tube placement. Family History:  Her family history includes Breast cancer in her maternal grandmother and paternal grandmother; Diabetes in her mother; Heart disease in her maternal grandfather; Multiple sclerosis in her mother. Social History:   reports that she has never smoked. She has never used smokeless tobacco. She reports that she does not drink alcohol and does not use drugs.  Prior to Admission medications   Medication Sig Start Date End Date Taking? Authorizing Provider  B Complex Vitamins (VITAMIN B COMPLEX PO) Take 1 tablet by mouth as needed. gummy   Yes [provider]  Iron Carbonyl-Vitamin C-FOS (CHEWABLE IRON PO) Take 1 tablet by mouth daily.   Yes [provider]  LamoTRIgine 200 MG TB24 24 hour tablet Take 1 tablet (200 mg total) by mouth 2 (two) times daily. 01/22/23  Yes [name], MD  Norgestim-Eth Charlott Holler Triphasic (TRI-SPRINTEC PO) Take 1 tablet by mouth daily with supper.   Yes [provider]  ondansetron (ZOFRAN-ODT) 4 MG disintegrating tablet Take 4 mg by mouth 3 (three) times daily as needed. 04/29/22  Yes [provider]  Misc. Devices (ROLLING WALKER/BURGUNDY) MISC 1 rolling walker for home use 01/16/22  Garlon Hatchet, PA-C  sucralfate (CARAFATE) 1 GM/10ML suspension Take 10 mLs (1 g total) by mouth 2 (two) times daily. Patient not taking: Reported on 02/25/2023 02/21/23   Legrand Como, PA-C    Current Facility-Administered Medications  Medication Dose Route Frequency Provider Last Rate Last Admin  . acetaminophen (TYLENOL) tablet 650 mg  650 mg Oral Q6H PRN Charlsie Quest, MD   650 mg at 03/24/23 1610   Or  . acetaminophen (TYLENOL) suppository 650 mg  650 mg Rectal Q6H PRN Charlsie Quest, MD      . lamoTRIgine (LAMICTAL XR)  24 hour tablet 200 mg  200 mg Oral BID Darreld Mclean R, MD   200 mg at 03/24/23 0842  . Norgestimate-Ethinyl Estradiol Triphasic 0.18/0.215/0.25 MG-35 MCG tablet 1 tablet  1 tablet Oral Q supper Charlsie Quest, MD   1 tablet at 03/23/23 2244  . ondansetron (ZOFRAN) tablet 4 mg  4 mg Oral Q6H PRN Charlsie Quest, MD       Or  . ondansetron (ZOFRAN) injection 4 mg  4 mg Intravenous Q6H PRN Charlsie Quest, MD      . senna-docusate (Senokot-S) tablet 1 tablet  1 tablet Oral QHS PRN Darreld Mclean R, MD      . sodium chloride flush (NS) 0.9 % injection 3 mL  3 mL Intravenous Q12H Charlsie Quest, MD   3 mL at 03/24/23 0843    Allergies as of 03/23/2023  . (No Known Allergies)    Review of Systems  Constitutional:  Positive for malaise/fatigue and weight loss. Negative for chills and fever.  HENT:  Negative for hearing loss and tinnitus.   Eyes:  Negative for blurred vision and double vision.  Respiratory:  Negative for cough and hemoptysis.   Cardiovascular:  Negative for chest pain and palpitations.  Gastrointestinal:  Positive for abdominal pain. Negative for blood in stool, constipation, diarrhea, heartburn, melena, nausea and vomiting.  Genitourinary:  Negative for dysuria and urgency.  Musculoskeletal:  Negative for myalgias and neck pain.  Skin:  Negative for itching and rash.  Neurological:  Positive for seizures. Negative for loss of consciousness.  Psychiatric/Behavioral:  Negative for depression and suicidal ideas.        Physical Exam:  Vital signs in last 24 hours: Temp:  [97.4 F (36.3 C)-98.3 F (36.8 C)] 97.7 F (36.5 C) (08/19 0626) Pulse Rate:  [62-75] 65 (08/19 0626) Resp:  [16-18] 16 (08/19 0626) BP: (83-102)/(59-74) 83/60 (08/19 0626) SpO2:  [96 %-100 %] 100 % (08/19 0626) Weight:  [35.4 kg] 35.4 kg (08/18 1409) Last BM Date : 03/22/23 Last BM recorded by nurses in past 5 days No data recorded  Physical Exam Constitutional:      Comments: Malnourished,  ill-appearing female  HENT:     Head: Normocephalic and atraumatic.     Nose: Nose normal. No congestion.     Mouth/Throat:     Mouth: Mucous membranes are moist.     Pharynx: Oropharynx is clear.  Eyes:     General: No scleral icterus.    Extraocular Movements: Extraocular movements intact.  Cardiovascular:     Rate and Rhythm: Normal rate and regular rhythm.  Pulmonary:     Effort: Pulmonary effort is normal. No respiratory distress.  Abdominal:     General: Abdomen is flat. Bowel sounds are normal. There is no distension.     Palpations: Abdomen is soft. There is no mass.     Tenderness:  There is no abdominal tenderness. There is no guarding or rebound.     Hernia: No hernia is present.  Musculoskeletal:        General: No swelling. Normal range of motion.     Cervical back: Normal range of motion and neck supple.  Skin:    General: Skin is warm and dry.     Coloration: Skin is pale. Skin is not jaundiced.  Neurological:     General: No focal deficit present.     Mental Status: She is oriented to person, place, and time.  Psychiatric:        Mood and Affect: Mood normal.        Behavior: Behavior normal.        Thought Content: Thought content normal.        Judgment: Judgment normal.      LAB RESULTS: Recent Labs    03/23/23 1632 03/24/23 0353  WBC 5.7 6.0  HGB 11.6* 9.4*  HCT 37.3 30.0*  PLT 285 235   BMET Recent Labs    03/23/23 1632 03/24/23 0353  NA 136 137  K 3.9 3.8  CL 103 106  CO2 27 25  GLUCOSE 82 79  BUN 7 9  CREATININE 0.77 0.80  CALCIUM 8.9 8.4*   LFT Recent Labs    03/23/23 1632  PROT 7.3  ALBUMIN 3.8  AST 15  ALT 16  ALKPHOS 55  BILITOT 0.6   PT/INR No results for input(s): "LABPROT", "INR" in the last 72 hours.  STUDIES: CT Angio Abd/Pel W and/or Wo Contrast  Result Date: 03/23/2023 CLINICAL DATA:  Evaluate for mesenteric ischemia. Abdominal pain and nausea. EXAM: CTA ABDOMEN AND PELVIS WITHOUT AND WITH CONTRAST  TECHNIQUE: Multidetector CT imaging of the abdomen and pelvis was performed using the standard protocol during bolus administration of intravenous contrast. Multiplanar reconstructed images and MIPs were obtained and reviewed to evaluate the vascular anatomy. RADIATION DOSE REDUCTION: This exam was performed according to the departmental dose-optimization program which includes automated exposure control, adjustment of the mA and/or kV according to patient size and/or use of iterative reconstruction technique. CONTRAST:  OMNIPAQUE IOHEXOL 350 MG/ML SOLN COMPARISON:  CT abdomen and pelvis 01/15/2022 FINDINGS: VASCULAR Aorta: Normal caliber aorta without aneurysm, dissection, vasculitis or significant stenosis. Celiac: Patent without evidence of aneurysm, dissection, vasculitis or significant stenosis. SMA: Patent without evidence of aneurysm, dissection, vasculitis or significant stenosis. Superior mesenteric artery distance is decreased measuring 4.6 mm. Angle is not well measured secondary to positioning of the artery. Renals: Both renal arteries are patent without evidence of aneurysm, dissection, vasculitis, fibromuscular dysplasia or significant stenosis. IMA: Patent without evidence of aneurysm, dissection, vasculitis or significant stenosis. Inflow: Patent without evidence of aneurysm, dissection, vasculitis or significant stenosis. Proximal Outflow: Bilateral common femoral and visualized portions of the superficial and profunda femoral arteries are patent without evidence of aneurysm, dissection, vasculitis or significant stenosis. Veins: No acute abnormality. Review of the MIP images confirms the above findings. NON-VASCULAR Lower chest: No acute abnormality. Hepatobiliary: No focal liver abnormality is seen. No gallstones, gallbladder wall thickening, or biliary dilatation. Pancreas: Unremarkable. No pancreatic ductal dilatation or surrounding inflammatory changes. Spleen: Normal in size without  focal abnormality. Adrenals/Urinary Tract: Adrenal glands are unremarkable. Kidneys are normal, without renal calculi, focal lesion, or hydronephrosis. Bladder is unremarkable. Stomach/Bowel: Stomach is within normal limits. Appendix is not seen. No evidence of bowel wall thickening, distention, or inflammatory changes. There is a large amount of stool throughout the colon.  Lymphatic: No enlarged lymph nodes. Reproductive: Uterus and bilateral adnexa are unremarkable. Other: No abdominal wall hernia or abnormality. No abdominopelvic ascites. Musculoskeletal: No acute or significant osseous findings. IMPRESSION: 1. No evidence for mesenteric ischemia. 2. Decreased superior mesenteric artery distance measuring 4.6 mm. 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 NON-VASCULAR 1. No acute localizing process in the abdomen or pelvis. 2. Large amount of stool throughout the colon. Electronically Signed   By: Darliss Cheney M.D.   On: 03/23/2023 19:11      Impression/Plan   27 year old female with multiple year history of epigastric pain with eating with extensive negative workup including HIDA scan, CT, lab work.  Recent EGD showed celiac with positive serology but no improvement on gluten-free diet.  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  Superior mesenteric artery syndrome ??? Postprandial epigastric pain with EGD showing celiac disease, negative HIDA scan, negative CT abdomen pelvis with contrast.  Recent CTA confirmed superior mesenteric artery syndrome.  Labs stable. CT raised ? Of it did noty confirm - see below CEG - Consider conservative management with NG tube and nutrition consult. -May need enteral nutrition via NG tube, if not an option may need to consider TPN - If patient fails conservative management may need to consider general surgery consult - continue to monitor electrolytes and replenish prn - Fluid  resuscitation  Iron deficiency anemia  Likely secondary to malnutrition.  Hgb 9.6 (possible hemodilution from 1 L saline).  Baseline around 11.  Does not tolerate oral iron and recently referred to hematology for IV iron.  No overt bleeding -- Iron studies -- Consider ordering IV iron -- Continue daily CBC and transfuse as needed to maintain HGB > 7   Celiac disease -- nutrition consult -- continue gluten free diet  History of epilepsy Recent seizure 8/15 - On lamotrigine  Thank you for your kind consultation, we will continue to follow.   Bayley Leanna Sato  03/24/2023, 10:26 AM      [facility] Attending   I have taken an interval history, reviewed the chart and examined the patient. I agree with the Advanced Practitioner's note, impression and recommendations with the following additions:  Very complicated situation - we know she has celiac disease - Also has findings of superior mesenteric artery syndrome but that entity is not something that is clearcut and may not have anything to do with her sxs. Also - w/ elevation of Abs in late July I doubt she was completely gluten free x months though perhaps.   1) Tube feedings seem reasonable but will try Cortrak instead of standard NG tube.   2) Psychiatry consult - ? ARFID +/- other comorbidities - I wonder about using TCA and/or mirtazapine. She has immediate pc pain in epigastrium which I cannot explain based upon known abnormalities. Seems like enhanced visceral nociception.  3) Again - SMA syndrome is not a clearcut entity - anatomic changes are likely from weight loss and hopefully will improve w/ weight gain. Do not think vascular surgery consult needed at this point and would like to avoid - we may need them to weigh in depending upon trajectory. The CT has shown that it is possible but the study was not conclusive as the angle of the SMA was not able to be measured on this study.   Iva Boop, MD, [facility] [place]  Gastroenterology See Loretha Stapler on call - gastroenterology for best contact person  03/24/2023 4:21 PM

## 2023-03-24 NOTE — Progress Notes (Signed)
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  She does have chronic nausea and postprandial epigastric pain, symptoms are recently worsening at least a month.  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  HIDA scan and up ultrasounds were negative.  In the emergency room electrolytes are normal.  No evidence of infection.  Blood pressure remains soft.   Assessment & Plan:   Postprandial abdominal pain associate with nausea and progressive weight loss Severe protein calorie malnutrition as evidenced by Nutrition Status: Nutrition Problem: Severe Malnutrition Etiology: chronic illness Signs/Symptoms: mild fat depletion, severe muscle depletion, percent weight loss (22% in 1 year) Percent weight loss: 22 % (in 1 year) Interventions: Refer to RD note for recommendations, MVI, Tube feeding Failure to thrive  Possibly from celiac disease versus SMA syndrome. On gluten-free diet. Significant symptoms causing extreme debility and weight loss.  GI following.  Starting on NG tube feeding.  Close monitoring of electrolytes, vitamin levels.  Nutrition is following.  At risk of refeeding syndrome.  Will need very close monitoring. If she responds to postpyloric NG tube feeding, may benefit with J-tube placement.  Seizure disorder: Stable on Lamictal.     DVT prophylaxis: SCDs Start: 03/23/23 2105   Code Status: Full code Family Communication: Father at bedside Disposition Plan: Status is: Inpatient Remains inpatient appropriate because: Severely symptomatic weight loss, unable to eat.      Consultants:  Gastroenterology  Procedures:  None  Antimicrobials:  None   Subjective: Patient seen and examined.  Father at the bedside.  At rest and while not eating, she does not have any symptoms.  She feels hungry but cannot eat because it brings the pain.  Anxious about all ongoing problems.  Blood pressures remain low, however she has very thin extremities and no vascular tone.  Objective: Vitals:   03/23/23 2205 03/24/23 0239 03/24/23 0626 03/24/23 1447  BP: 93/65 (!) 87/59 (!) 83/60 (!) 86/54  Pulse: 75 70 65 72  Resp: 16 18 16    Temp: 98.2 F (36.8 C) 98 F (36.7 C) 97.7 F (36.5 C) 98.2 F (36.8 C)  TempSrc:    Oral  SpO2: 100% 98% 100% 99%  Weight:      Height:       No intake or output data in the 24 hours ending 03/24/23 1618 Filed Weights   03/23/23 1409  Weight: 35.4 kg    Examination:  General: Looks comfortable at rest.  Cachectic. Cardiovascular: S1-S2 normal.  Regular rate rhythm. Respiratory: Bilateral clear.  No added sounds. Gastrointestinal: Soft.  Nontender.  Bowel sound present. Ext: No swelling or edema.  No cyanosis.  Has adequate peripheral pulses. Neuro: Alert awake and oriented.  No focal neurological deficits.  Data Reviewed: I have personally reviewed following labs and imaging studies  CBC: Recent Labs  Lab 03/23/23 1632 03/24/23 0353  WBC 5.7 6.0  NEUTROABS 3.6  --   HGB 11.6* 9.4*  HCT 37.3 30.0*  MCV 90.3 90.6  PLT 285 235   Basic Metabolic Panel: Recent Labs  Lab 03/23/23 1632 03/24/23 0353  NA 136 137  K 3.9 3.8  CL 103 106  CO2 27 25  GLUCOSE  82 79  BUN 7 9  CREATININE 0.77 0.80  CALCIUM 8.9 8.4*   GFR: Estimated Creatinine Clearance: 59 mL/min (by C-G formula based on SCr of 0.8 mg/dL). Liver Function Tests: Recent Labs  Lab 03/23/23 1632  AST 15  ALT 16  ALKPHOS 55  BILITOT 0.6  PROT 7.3  ALBUMIN 3.8   Recent Labs  Lab 03/23/23 1632  LIPASE 36   No results for input(s): "AMMONIA"  in the last 168 hours. Coagulation Profile: No results for input(s): "INR", "PROTIME" in the last 168 hours. Cardiac Enzymes: No results for input(s): "CKTOTAL", "CKMB", "CKMBINDEX", "TROPONINI" in the last 168 hours. BNP (last 3 results) No results for input(s): "PROBNP" in the last 8760 hours. HbA1C: No results for input(s): "HGBA1C" in the last 72 hours. CBG: Recent Labs  Lab 03/24/23 1459  GLUCAP 84   Lipid Profile: No results for input(s): "CHOL", "HDL", "LDLCALC", "TRIG", "CHOLHDL", "LDLDIRECT" in the last 72 hours. Thyroid Function Tests: No results for input(s): "TSH", "T4TOTAL", "FREET4", "T3FREE", "THYROIDAB" in the last 72 hours. Anemia Panel: No results for input(s): "VITAMINB12", "FOLATE", "FERRITIN", "TIBC", "IRON", "RETICCTPCT" in the last 72 hours. Sepsis Labs: No results for input(s): "PROCALCITON", "LATICACIDVEN" in the last 168 hours.  No results found for this or any previous visit (from the past 240 hour(s)).       Radiology Studies: CT Angio Abd/Pel W and/or Wo Contrast  Result Date: 03/23/2023 CLINICAL DATA:  Evaluate for mesenteric ischemia. Abdominal pain and nausea. EXAM: CTA ABDOMEN AND PELVIS WITHOUT AND WITH CONTRAST TECHNIQUE: Multidetector CT imaging of the abdomen and pelvis was performed using the standard protocol during bolus administration of intravenous contrast. Multiplanar reconstructed images and MIPs were obtained and reviewed to evaluate the vascular anatomy. RADIATION DOSE REDUCTION: This exam was performed according to the departmental dose-optimization program which includes automated exposure control, adjustment of the mA and/or kV according to patient size and/or use of iterative reconstruction technique. CONTRAST:  OMNIPAQUE IOHEXOL 350 MG/ML SOLN COMPARISON:  CT abdomen and pelvis 01/15/2022 FINDINGS: VASCULAR Aorta: Normal caliber aorta without aneurysm, dissection, vasculitis or significant stenosis. Celiac: Patent without  evidence of aneurysm, dissection, vasculitis or significant stenosis. SMA: Patent without evidence of aneurysm, dissection, vasculitis or significant stenosis. Superior mesenteric artery distance is decreased measuring 4.6 mm. Angle is not well measured secondary to positioning of the artery. Renals: Both renal arteries are patent without evidence of aneurysm, dissection, vasculitis, fibromuscular dysplasia or significant stenosis. IMA: Patent without evidence of aneurysm, dissection, vasculitis or significant stenosis. Inflow: Patent without evidence of aneurysm, dissection, vasculitis or significant stenosis. Proximal Outflow: Bilateral common femoral and visualized portions of the superficial and profunda femoral arteries are patent without evidence of aneurysm, dissection, vasculitis or significant stenosis. Veins: No acute abnormality. Review of the MIP images confirms the above findings. NON-VASCULAR Lower chest: No acute abnormality. Hepatobiliary: No focal liver abnormality is seen. No gallstones, gallbladder wall thickening, or biliary dilatation. Pancreas: Unremarkable. No pancreatic ductal dilatation or surrounding inflammatory changes. Spleen: Normal in size without focal abnormality. Adrenals/Urinary Tract: Adrenal glands are unremarkable. Kidneys are normal, without renal calculi, focal lesion, or hydronephrosis. Bladder is unremarkable. Stomach/Bowel: Stomach is within normal limits. Appendix is not seen. No evidence of bowel wall thickening, distention, or inflammatory changes. There is a large amount of stool throughout the colon. Lymphatic: No enlarged lymph nodes. Reproductive: Uterus and bilateral adnexa are unremarkable. Other: No abdominal wall hernia or abnormality. No abdominopelvic ascites. Musculoskeletal: No acute or significant osseous findings. IMPRESSION:  1. No evidence for mesenteric ischemia. 2. Decreased superior mesenteric artery distance measuring 4.6 mm. This can be seen in the  setting of superior mesenteric artery syndrome. NON-VASCULAR 1. No acute localizing process in the abdomen or pelvis. 2. Large amount of stool throughout the colon. Electronically Signed   By: Darliss Cheney M.D.   On: 03/23/2023 19:11        Scheduled Meds:  lamoTRIgine  200 mg Oral BID   [START ON 03/25/2023] multivitamin  15 mL Per Tube Daily   Norgestimate-Ethinyl Estradiol Triphasic  1 tablet Oral Q supper   sodium chloride flush  3 mL Intravenous Q12H   Continuous Infusions:  feeding supplement (KATE FARMS STANDARD 1.4)     lactated ringers     sodium chloride       LOS: 1 day    Time spent: 35 minutes    Dorcas Carrow, MD [facility]

## 2023-03-24 NOTE — Telephone Encounter (Addendum)
I discussed with Dr. [name] in Lynn absence for clarification of orders , see results notes on 03/06/23.  Per Dr. [name] D/C order for CTA and allergy consult. , try and expedite nutrition consult. Patient with celiac disease and Dr. [name] feels celiac is responsible for her symptoms. Message was left for mom to call back to discuss further. Patient is inpatient at this time.  Will continue to try and reach mom to expedite nutrition consult if not addressed during current admission.    ----- Message from Legrand Como sent at 03/20/2023  2:15 PM EDT ----- No that is my mistake! I mistook her upcoming appt as the EGD. Everything has been negative so far. Lets do a CTA ab/pelvis to r/o celiac artery compression syndrome (in addition to food allergy testing). Thanks! Let me know when they get this scheduled so I can make an alert to check. ----- Message ----- From: Sunday Spillers, CMA Sent: 03/20/2023   1:58 PM EDT To: Legrand Como, PA-C  she has actually already had th egd on 7/23! I read that wrong, I called the mom back and asked if she's taking the Ibgard and she hasn't started yet that, I told her to please try that while we send the referral for the food allergist she agreed. ----- Message ----- From: Legrand Como, PA-C Sent: 03/20/2023  10:30 AM EDT To: Sunday Spillers, CMA   We are planning on EGD for further evaluation. Can refer for food allergist. ----- Message ----- From: Sunday Spillers, CMA Sent: 03/20/2023  10:04 AM EDT To: Legrand Como, PA-C  Patients mother called to find out what the next treatment plan is. States the patient is in a lot of abdominal pain, seeking a allergy test to rule out diary products. Is really concerned about her weight loss and eating habits. Please advise

## 2023-03-24 NOTE — Progress Notes (Signed)
Initial Nutrition Assessment  DOCUMENTATION CODES:   Severe malnutrition in context of chronic illness  INTERVENTION:  - Gluten Free Diet.   - Plan to place NGT tube and start tube feeds. Once feeding tube xray verified in the stomach, recommend: [employer] 1.4 at 17mL/hr for 24 hours to assess tolerance and monitor electrolytes.   - If patient tolerates and electrolytes stable, recommend advancing by 10mL Q24H to goal below: [employer] 1.4 at 45 ml/h (1080 ml per day Provides 1512 kcal, 66 gm protein, 777 ml free water daily  - Monitor magnesium, potassium, and phosphorus BID for at least 3 days, MD to replete as needed, as pt is at risk for refeeding syndrome.   - Checking vitamins due to concern for deficiency: vitamin B6, vitamin B12, thiamine, folate. - Add liquid multivitamin daily via tube.  - Monitor bowel movements.  - Monitor weight trends. Daily weight.    NUTRITION DIAGNOSIS:   Severe Malnutrition related to chronic illness as evidenced by mild fat depletion, severe muscle depletion, percent weight loss (22% in 1 year).  GOAL:   Patient will meet greater than or equal to 90% of their needs  MONITOR:   PO intake, Labs, Weight trends, TF tolerance, I & O's  REASON FOR ASSESSMENT:   Consult Enteral/tube feeding initiation and management  ASSESSMENT:   27 y.o. 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 She has been getting lightheaded and dizzy easily. She has been fatigued. She reports feeling numbness and tingling sensation throughout her limbs. She states that she becomes nauseous easily but has not vomiting. She states that she does not have any diarrhea but will get constipated occasionally.   Met with patient  and parents at bedside. All aided in provided nutrition history.   Parents report patient's highest weight ever was 116# however she has not weighed this since high school. A more recent UBW over the past 5-6 years has been 105# but she has been steadily losing weight over that time frame. Per EMR, patient weighed at 104# on 01/15/22 and then 81# by 01/07/23. This is a 23# or 22% weight loss in 1 year, which is significant for the time frame. However, weight without significant changes over the past 2 months (81# to 78#).   Patient typically consumes 3 small meals a day. Recently diagnosed with celiac disease so eats a gluten free diet at home. Also avoids dairy or takes lactaid and avoids spices as they will irritate her stomach. She usually has gluten free cereal with almond milk or a gluten free waffle for breakfast, a sandwich of [place] or ham and bacon with cheese on gluten free bread for lunch, and a protein with vegetables for dinner.  Appetite is usually good but patient is limited by nausea and postprandial abdominal pain.   She used to consume [employer] (chocolate) daily but has not had one in over 1 month. Also used to take a gummy multivitamin (as she has difficulty swallowing pills) but has not taken this in over 1 month as well.   Patient and her parents have a desire to help her gain weight to achieve a healthy weight.  GI following patient this admission and recommend NG tube and consideration of enteral nutrition (TPN if not tolerated). Discussed patient with GI PA. Given  patient's state of malnutrition and that she continues to lose weight despite eating as much as she can tolerate, agree that patient would benefit from an NGT and tube feeds. GI on board and to place order for NGT today. Patient and family also on board for NGT and tube feeds. Recommend meeting 100% of estimated needs at this time to facilitate weight gain. Will plan to trial Molli Posey for plant based gluten free  option.  Suspect patient is at high risk of malnutrition so recommending on 10mL for 24 hours to assess electrolytes.   Also suspect patient may have some vitamin deficiencies given state of malnutrition and reports of numbness/tingling, fatigue and as she has not been taking her multivitamin.    Medications reviewed and include: -  Labs reviewed:  -   NUTRITION - FOCUSED PHYSICAL EXAM:  Flowsheet Row Most Recent Value  Orbital Region Mild depletion  Upper Arm Region No depletion  Thoracic and Lumbar Region Mild depletion  Buccal Region Mild depletion  Temple Region Mild depletion  Clavicle Bone Region Severe depletion  Clavicle and Acromion Bone Region Severe depletion  Scapular Bone Region Unable to assess  Dorsal Hand Mild depletion  Patellar Region Moderate depletion  Anterior Thigh Region Moderate depletion  Posterior Calf Region Mild depletion  Edema (RD Assessment) None  Hair Reviewed  Eyes Reviewed  Mouth Reviewed  Skin Reviewed  Nails Reviewed       Diet Order:   Diet Order             Diet gluten free Room service appropriate? Yes; Fluid consistency: Thin  Diet effective now                   EDUCATION NEEDS:  Education needs have been addressed  Skin:  Skin Assessment: Reviewed RN Assessment  Last BM:  8/17  Height:  Ht Readings from Last 1 Encounters:  03/23/23 5\' 2"  (1.575 m)   Weight:  Wt Readings from Last 1 Encounters:  03/23/23 35.4 kg    BMI:  Body mass index is 14.27 kg/m.  Estimated Nutritional Needs:  Kcal:  1400-1750 kcals Protein:  60-70 grams Fluid:  >/= 1.5L    Shelle Iron RD, LDN For contact information, refer to [facility].

## 2023-03-25 ENCOUNTER — Ambulatory Visit: Payer: Medicaid Other | Admitting: Registered"

## 2023-03-25 DIAGNOSIS — K900 Celiac disease: Secondary | ICD-10-CM | POA: Diagnosis not present

## 2023-03-25 DIAGNOSIS — R627 Adult failure to thrive: Principal | ICD-10-CM

## 2023-03-25 DIAGNOSIS — R1013 Epigastric pain: Secondary | ICD-10-CM | POA: Diagnosis not present

## 2023-03-25 DIAGNOSIS — K30 Functional dyspepsia: Secondary | ICD-10-CM

## 2023-03-25 LAB — MAGNESIUM
Magnesium: 1.8 mg/dL (ref 1.7–2.4)
Magnesium: 2 mg/dL (ref 1.7–2.4)

## 2023-03-25 LAB — GLUCOSE, RANDOM
Glucose, Bld: 191 mg/dL — ABNORMAL HIGH (ref 70–99)
Glucose, Bld: 62 mg/dL — ABNORMAL LOW (ref 70–99)

## 2023-03-25 LAB — GLUCOSE, CAPILLARY
Glucose-Capillary: 176 mg/dL — ABNORMAL HIGH (ref 70–99)
Glucose-Capillary: 182 mg/dL — ABNORMAL HIGH (ref 70–99)
Glucose-Capillary: 39 mg/dL — CL (ref 70–99)
Glucose-Capillary: 44 mg/dL — CL (ref 70–99)
Glucose-Capillary: 53 mg/dL — ABNORMAL LOW (ref 70–99)
Glucose-Capillary: 57 mg/dL — ABNORMAL LOW (ref 70–99)
Glucose-Capillary: 62 mg/dL — ABNORMAL LOW (ref 70–99)
Glucose-Capillary: 63 mg/dL — ABNORMAL LOW (ref 70–99)
Glucose-Capillary: 67 mg/dL — ABNORMAL LOW (ref 70–99)
Glucose-Capillary: 81 mg/dL (ref 70–99)
Glucose-Capillary: 82 mg/dL (ref 70–99)
Glucose-Capillary: 83 mg/dL (ref 70–99)

## 2023-03-25 LAB — IRON AND TIBC
Iron: 134 ug/dL (ref 28–170)
Saturation Ratios: 31 % (ref 10.4–31.8)
TIBC: 437 ug/dL (ref 250–450)
UIBC: 303 ug/dL

## 2023-03-25 LAB — CBC
HCT: 37 % (ref 36.0–46.0)
Hemoglobin: 11.7 g/dL — ABNORMAL LOW (ref 12.0–15.0)
MCH: 29 pg (ref 26.0–34.0)
MCHC: 31.6 g/dL (ref 30.0–36.0)
MCV: 91.8 fL (ref 80.0–100.0)
Platelets: 283 10*3/uL (ref 150–400)
RBC: 4.03 MIL/uL (ref 3.87–5.11)
RDW: 15.9 % — ABNORMAL HIGH (ref 11.5–15.5)
WBC: 6.4 10*3/uL (ref 4.0–10.5)
nRBC: 0 % (ref 0.0–0.2)

## 2023-03-25 LAB — PHOSPHORUS
Phosphorus: 2.7 mg/dL (ref 2.5–4.6)
Phosphorus: 3.7 mg/dL (ref 2.5–4.6)

## 2023-03-25 LAB — VITAMIN B12: Vitamin B-12: 256 pg/mL (ref 180–914)

## 2023-03-25 LAB — FOLATE: Folate: 4.7 ng/mL — ABNORMAL LOW (ref >5.9)

## 2023-03-25 MED ORDER — DEXTROSE 50 % IV SOLN
25.0000 g | INTRAVENOUS | Status: AC
  Administered 2023-03-25: 25 g via INTRAVENOUS
  Filled 2023-03-25: qty 50

## 2023-03-25 MED ORDER — HYDROXYZINE HCL 10 MG PO TABS
10.0000 mg | ORAL_TABLET | Freq: Three times a day (TID) | ORAL | Status: DC | PRN
  Administered 2023-03-25 (×2): 10 mg via ORAL
  Filled 2023-03-25 (×3): qty 1

## 2023-03-25 MED ORDER — LIDOCAINE HCL URETHRAL/MUCOSAL 2 % EX GEL
1.0000 | Freq: Once | CUTANEOUS | Status: AC
  Administered 2023-03-25: 1 via TOPICAL
  Filled 2023-03-25: qty 5

## 2023-03-25 MED ORDER — DEXTROSE 10 % IV SOLN: INTRAVENOUS | Status: DC

## 2023-03-25 MED ORDER — PANTOPRAZOLE SODIUM 40 MG PO TBEC
40.0000 mg | DELAYED_RELEASE_TABLET | Freq: Every day | ORAL | Status: DC
  Administered 2023-03-25: 40 mg via ORAL
  Filled 2023-03-25 (×3): qty 1

## 2023-03-25 MED ORDER — GLUCOSE 40 % PO GEL
2.0000 | ORAL | Status: DC
  Filled 2023-03-25: qty 2.42

## 2023-03-25 NOTE — Progress Notes (Signed)
Hypoglycemic Event  CBG: 44  Treatment: monitor patient hold treatment per provider  Symptoms: None  Follow-up CBG: Time:1219 CBG Result: 53  Possible Reasons for Event: Unknown, patient ate 75% of breakfast  Comments/MD notified: monitor patient hold treatment per provider D10 IV fluid were administered at rate of 100 ml/hr per existing order. Patient was reconnected and solution was continuous post event. Will continue to monitor patient for hyperglycemia.    Peggye Ley

## 2023-03-25 NOTE — Progress Notes (Signed)
Attempted Small bore NG placement x4 over a span of 2 hours. Attempted with Lidocaine jelly and met resistance in both nares despite repositioning the patient's posture/head.   Ghimire MD aware of failed attempts at this time. RN encouraged patient to try and have more oral intake.

## 2023-03-25 NOTE — Progress Notes (Addendum)
Progress Note   LOS: 2 days   Chief Complaint: Pain with eating   Subjective   Patient states she is doing better today.  States she is concerned as her sugars have been abnormal dropping as low as 39.  Mother states she was able to tolerate gluten-free chocolate chip cookie, raisins, and a piece of candy without difficulty.  She is also tolerating lactose-free milk that patient typically likes to enjoy.  Psych is planning to see patient today for further recommendations  Patient is extremely anxious about cortrak tube placement and possible tube feeds.  Would consider antianxiety medication to help with this.  She is optimistic about improving her health during this hospitalization   Objective   Vital signs in last 24 hours: Temp:  [97.9 F (36.6 C)-98.7 F (37.1 C)] 98.7 F (37.1 C) (08/20 0957) Pulse Rate:  [72-86] 86 (08/20 0957) Resp:  [14-17] 17 (08/20 0957) BP: (70-98)/(52-59) 92/59 (08/20 0957) SpO2:  [99 %-100 %] 100 % (08/20 0957) Weight:  [41.4 kg] 41.4 kg (08/20 0500) Last BM Date : 03/22/23 Last BM recorded by nurses in past 5 days No data recorded  General:   female in no acute distress  Heart:  Regular rate and rhythm; no murmurs Pulm: Clear anteriorly; no wheezing Abdomen: soft, nondistended, normal bowel sounds in all quadrants. Nontender without guarding. No organomegaly appreciated. Extremities:  No edema Neurologic:  Alert and  oriented x4;  No focal deficits.  Psych:  Cooperative. Normal mood and affect.  Intake/Output from previous day: 08/19 0701 - 08/20 0700 In: 2668 [P.O.:600; I.V.:1068; IV Piggyback:1000] Out: -  Intake/Output this shift: No intake/output data recorded.  Studies/Results: CT Angio Abd/Pel W and/or Wo Contrast  Result Date: 03/23/2023 CLINICAL DATA:  Evaluate for mesenteric ischemia. Abdominal pain and nausea. EXAM: CTA ABDOMEN AND PELVIS WITHOUT AND WITH CONTRAST TECHNIQUE: Multidetector CT imaging of the abdomen and pelvis  was performed using the standard protocol during bolus administration of intravenous contrast. Multiplanar reconstructed images and MIPs were obtained and reviewed to evaluate the vascular anatomy. RADIATION DOSE REDUCTION: This exam was performed according to the departmental dose-optimization program which includes automated exposure control, adjustment of the mA and/or kV according to patient size and/or use of iterative reconstruction technique. CONTRAST:  OMNIPAQUE IOHEXOL 350 MG/ML SOLN COMPARISON:  CT abdomen and pelvis 01/15/2022 FINDINGS: VASCULAR Aorta: Normal caliber aorta without aneurysm, dissection, vasculitis or significant stenosis. Celiac: Patent without evidence of aneurysm, dissection, vasculitis or significant stenosis. SMA: Patent without evidence of aneurysm, dissection, vasculitis or significant stenosis. Superior mesenteric artery distance is decreased measuring 4.6 mm. Angle is not well measured secondary to positioning of the artery. Renals: Both renal arteries are patent without evidence of aneurysm, dissection, vasculitis, fibromuscular dysplasia or significant stenosis. IMA: Patent without evidence of aneurysm, dissection, vasculitis or significant stenosis. Inflow: Patent without evidence of aneurysm, dissection, vasculitis or significant stenosis. Proximal Outflow: Bilateral common femoral and visualized portions of the superficial and profunda femoral arteries are patent without evidence of aneurysm, dissection, vasculitis or significant stenosis. Veins: No acute abnormality. Review of the MIP images confirms the above findings. NON-VASCULAR Lower chest: No acute abnormality. Hepatobiliary: No focal liver abnormality is seen. No gallstones, gallbladder wall thickening, or biliary dilatation. Pancreas: Unremarkable. No pancreatic ductal dilatation or surrounding inflammatory changes. Spleen: Normal in size without focal abnormality. Adrenals/Urinary Tract: Adrenal glands are  unremarkable. Kidneys are normal, without renal calculi, focal lesion, or hydronephrosis. Bladder is unremarkable. Stomach/Bowel: Stomach is within normal  limits. Appendix is not seen. No evidence of bowel wall thickening, distention, or inflammatory changes. There is a large amount of stool throughout the colon. Lymphatic: No enlarged lymph nodes. Reproductive: Uterus and bilateral adnexa are unremarkable. Other: No abdominal wall hernia or abnormality. No abdominopelvic ascites. Musculoskeletal: No acute or significant osseous findings. IMPRESSION: 1. No evidence for mesenteric ischemia. 2. Decreased superior mesenteric artery distance measuring 4.6 mm. 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 NON-VASCULAR 1. No acute localizing process in the abdomen or pelvis. 2. Large amount of stool throughout the colon. Electronically Signed   By: Darliss Cheney M.D.   On: 03/23/2023 19:11    Lab Results: Recent Labs    03/23/23 1632 03/24/23 0353  WBC 5.7 6.0  HGB 11.6* 9.4*  HCT 37.3 30.0*  PLT 285 235   BMET Recent Labs    03/23/23 1632 03/24/23 0353 03/25/23 0817  NA 136 137  --   K 3.9 3.8  --   CL 103 106  --   CO2 27 25  --   GLUCOSE 82 79 191*  BUN 7 9  --   CREATININE 0.77 0.80  --   CALCIUM 8.9 8.4*  --    LFT Recent Labs    03/23/23 1632  PROT 7.3  ALBUMIN 3.8  AST 15  ALT 16  ALKPHOS 55  BILITOT 0.6   PT/INR No results for input(s): "LABPROT", "INR" in the last 72 hours.   Scheduled Meds:  lamoTRIgine  200 mg Oral BID   multivitamin  15 mL Per Tube Daily   Norgestimate-Ethinyl Estradiol Triphasic  1 tablet Oral Q supper   sodium chloride flush  3 mL Intravenous Q12H   Continuous Infusions:  dextrose 100 mL/hr at 03/25/23 0749   feeding supplement ([employer]ANDARD 1.4) Stopped (03/24/23 1802)      Patient profile:   27 year old female with past medical history significant for epilepsy, brain injury at birth, anxiety, IDA,  presenting with failure to thrive and a BMI of 14 with symptoms of pain with eating.  Negative extensive workup including HIDA scan, CT, lab work.  EGD showed celiac with positive serology but no improvement on gluten-free diet.  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 Postprandial epigastric pain with EGD showing celiac disease, negative HIDA scan, negative CT abdomen pelvis with contrast.  Recent CTA confirmed superior mesenteric artery syndrome.  Labs stable. CT raised ? Of it did not confirm. Able to tolerate foods she likes such as GF chocolate chip cookie and raisins it appears - continue with plans for Cortrak and tube feeds (psych consulted and appreciate their recs as patient has anxiety with thought of tube feeds) - continue to monitor electrolytes and replenish prn - Monitor clinical course   Iron deficiency anemia  Likely secondary to malnutrition.  Hgb 9.4 (possible hemodilution from fluid resuscitation.  Baseline around 11.  Does not tolerate oral iron and recently referred to hematology for IV iron.  No overt bleeding -- Iron studies -- Consider ordering IV iron -- Continue daily CBC and transfuse as needed to maintain HGB > 7    Celiac disease -- Appreciate dietician recs -- continue gluten free diet   History of epilepsy Recent seizure 8/15 - On lamotrigine   Bayley M McMichael  03/25/2023, 10:35 AM     [facility] Attending   I have taken  an interval history, reviewed the chart and examined the patient. I agree with the Advanced Practitioner's note, impression and recommendations with the following additions:  1) Malnutrition - awaiting small bore feeding tube - ICU RN will try to place and then start TF's. Folate mildly low. B12 ok - other labs pending  2) Celiac - gluten free  3) Sig hypoglycemia - most likely from malnutrition and depleted  glycogen stores - will check AM cortisol ? Adrenal insufficiency  4) Dyspepsia - add PPI - she is a little better today - I would consider possible buspirone helps anxiety and functional dyspepsia, maybe duloxetine but not yet  5) Anxiety - hydroxyzine per Psch - no eating disorder dx - not surprised though I still wonder about ARFID - CBT recommended  5) Abnl SMA - hold off on any vascular consult  Iva Boop, MD, [facility] See Loretha Stapler on call - gastroenterology for best contact person 03/25/2023 4:25 PM

## 2023-03-25 NOTE — Plan of Care (Signed)

## 2023-03-25 NOTE — Progress Notes (Signed)
Brief Nutrition Note  NGT did not get placed yesterday. Order placed for Cortrak however Cortrak service not available at [facility].  Reached out to GI and Hospitalist MD and plan to try and place Dobbhoff tube today.   INTERVENTION:  - Plan to place Dobbhoff tube and start tube feeds. Once feeding tube placed and xray verified in the stomach, recommend: [employer] 1.4 at 56mL/hr for 24 hours to assess tolerance and monitor electrolytes.    - If patient tolerates and electrolytes stable, consider advancing by 10mL Q24H to goal below: [employer] 1.4 at 45 ml/h (1080 ml per day Provides 1512 kcal, 66 gm protein, 777 ml free water daily   - Monitor magnesium, potassium, and phosphorus BID for at least 3 days, MD to replete as needed, as pt is at risk for refeeding syndrome.   Shelle Iron RD, LDN For contact information, refer to [facility].

## 2023-03-25 NOTE — Progress Notes (Signed)
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  She does have chronic nausea and postprandial epigastric pain, symptoms are recently worsening at least a month.  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  HIDA scan and ultrasounds were negative.  In the emergency room electrolytes are normal.  No evidence of infection.  Blood pressure remains soft.   Assessment & Plan:   Postprandial abdominal pain associate with nausea and progressive weight loss Severe protein calorie malnutrition as evidenced by Nutrition Status: Nutrition Problem: Severe Malnutrition Etiology: chronic illness Signs/Symptoms: mild fat depletion, severe muscle depletion, percent weight loss (22% in 1 year) Percent weight loss: 22 % (in 1 year) Interventions: Refer to RD note for recommendations, MVI, Tube feeding Failure to thrive  Possibly from celiac disease and less likely SMA syndrome. On gluten-free diet.  Intermittent toleration of diet. Significant symptoms causing extreme debility and weight loss.  GI following.  Core track recommended, not available at [facility].  Nursing to place small caliber NG tube and start feeding. Close monitoring of electrolytes, vitamin levels.  Nutrition is following.  At risk of refeeding syndrome.  Will need very close monitoring. If she responds to postpyloric NG tube feeding, may benefit with J-tube placement if no improvement with conservative management. Seen by psychiatry, started on hydroxyzine and mirtazapine.  Seizure disorder: Stable on  Lamictal.  Hypoglycemia: Recurrent due to poor intake.  Keep on maintenance dextrose fluid.  Finger prick blood sugars were verified from random glucose and they were matching.     DVT prophylaxis: SCDs Start: 03/23/23 2105   Code Status: Full code Family Communication: Father and mother at the bedside. Disposition Plan: Status is: Inpatient Remains inpatient appropriate because: Severely symptomatic weight loss, unable to eat.     Consultants:  Gastroenterology Psychiatry  Procedures:  None  Antimicrobials:  None   Subjective: Seen and examined.  Frustrated all night due to blood sugar checks.  Irritable in the morning.  She did however ate a whole sandwich last night for dinner and also ate good amount of breakfast. Less painful today.  Objective: Vitals:   03/25/23 0227 03/25/23 0500 03/25/23 0957 03/25/23 1334  BP: (!) 98/52  (!) 92/59 (!) 95/58  Pulse:   86 97  Resp:   17 16  Temp:   98.7 F (37.1 C) 98.6 F (37 C)  TempSrc:   Oral Oral  SpO2:   100% 100%  Weight:  41.4 kg    Height:        Intake/Output Summary (Last 24 hours) at 03/25/2023 1558 Last data filed at 03/25/2023 1300 Gross per 24 hour  Intake 2427.98 ml  Output --  Net 2427.98 ml   Filed Weights   03/23/23 1409 03/25/23 0500  Weight: 35.4 kg 41.4 kg    Examination:  General: Looks comfortable at rest.  Anxious. Neuro: Alert awake and oriented.  No focal neurological deficits.  Data Reviewed: I have personally reviewed following labs and imaging studies  CBC: Recent Labs  Lab 03/23/23 1632 03/24/23 0353  WBC 5.7 6.0  NEUTROABS 3.6  --   HGB 11.6* 9.4*  HCT 37.3 30.0*  MCV 90.3  90.6  PLT 285 235   Basic Metabolic Panel: Recent Labs  Lab 03/23/23 1632 03/24/23 0353 03/24/23 1549 03/25/23 0817 03/25/23 1215  NA 136 137  --   --   --   K 3.9 3.8  --   --   --   CL 103 106  --   --   --   CO2 27 25  --   --   --   GLUCOSE 82 79  --  191* 62*  BUN 7 9  --   --   --    CREATININE 0.77 0.80  --   --   --   CALCIUM 8.9 8.4*  --   --   --   MG  --   --  2.0 1.8  --   PHOS  --   --  4.3 2.7  --    GFR: Estimated Creatinine Clearance: 69 mL/min (by C-G formula based on SCr of 0.8 mg/dL). Liver Function Tests: Recent Labs  Lab 03/23/23 1632  AST 15  ALT 16  ALKPHOS 55  BILITOT 0.6  PROT 7.3  ALBUMIN 3.8   Recent Labs  Lab 03/23/23 1632  LIPASE 36   No results for input(s): "AMMONIA" in the last 168 hours. Coagulation Profile: No results for input(s): "INR", "PROTIME" in the last 168 hours. Cardiac Enzymes: No results for input(s): "CKTOTAL", "CKMB", "CKMBINDEX", "TROPONINI" in the last 168 hours. BNP (last 3 results) No results for input(s): "PROBNP" in the last 8760 hours. HbA1C: No results for input(s): "HGBA1C" in the last 72 hours. CBG: Recent Labs  Lab 03/25/23 0552 03/25/23 0719 03/25/23 0816 03/25/23 1144 03/25/23 1219  GLUCAP 176* 39* 182* 44* 53*   Lipid Profile: No results for input(s): "CHOL", "HDL", "LDLCALC", "TRIG", "CHOLHDL", "LDLDIRECT" in the last 72 hours. Thyroid Function Tests: No results for input(s): "TSH", "T4TOTAL", "FREET4", "T3FREE", "THYROIDAB" in the last 72 hours. Anemia Panel: Recent Labs    03/25/23 0817 03/25/23 1215  VITAMINB12 256  --   FOLATE 4.7*  --   TIBC  --  437  IRON  --  134   Sepsis Labs: No results for input(s): "PROCALCITON", "LATICACIDVEN" in the last 168 hours.  No results found for this or any previous visit (from the past 240 hour(s)).       Radiology Studies: CT Angio Abd/Pel W and/or Wo Contrast  Result Date: 03/23/2023 CLINICAL DATA:  Evaluate for mesenteric ischemia. Abdominal pain and nausea. EXAM: CTA ABDOMEN AND PELVIS WITHOUT AND WITH CONTRAST TECHNIQUE: Multidetector CT imaging of the abdomen and pelvis was performed using the standard protocol during bolus administration of intravenous contrast. Multiplanar reconstructed images and MIPs were obtained and  reviewed to evaluate the vascular anatomy. RADIATION DOSE REDUCTION: This exam was performed according to the departmental dose-optimization program which includes automated exposure control, adjustment of the mA and/or kV according to patient size and/or use of iterative reconstruction technique. CONTRAST:  OMNIPAQUE IOHEXOL 350 MG/ML SOLN COMPARISON:  CT abdomen and pelvis 01/15/2022 FINDINGS: VASCULAR Aorta: Normal caliber aorta without aneurysm, dissection, vasculitis or significant stenosis. Celiac: Patent without evidence of aneurysm, dissection, vasculitis or significant stenosis. SMA: Patent without evidence of aneurysm, dissection, vasculitis or significant stenosis. Superior mesenteric artery distance is decreased measuring 4.6 mm. Angle is not well measured secondary to positioning of the artery. Renals: Both renal arteries are patent without evidence of aneurysm, dissection, vasculitis, fibromuscular dysplasia or significant stenosis. IMA: Patent without evidence of aneurysm, dissection, vasculitis or significant  stenosis. Inflow: Patent without evidence of aneurysm, dissection, vasculitis or significant stenosis. Proximal Outflow: Bilateral common femoral and visualized portions of the superficial and profunda femoral arteries are patent without evidence of aneurysm, dissection, vasculitis or significant stenosis. Veins: No acute abnormality. Review of the MIP images confirms the above findings. NON-VASCULAR Lower chest: No acute abnormality. Hepatobiliary: No focal liver abnormality is seen. No gallstones, gallbladder wall thickening, or biliary dilatation. Pancreas: Unremarkable. No pancreatic ductal dilatation or surrounding inflammatory changes. Spleen: Normal in size without focal abnormality. Adrenals/Urinary Tract: Adrenal glands are unremarkable. Kidneys are normal, without renal calculi, focal lesion, or hydronephrosis. Bladder is unremarkable. Stomach/Bowel: Stomach is within normal  limits. Appendix is not seen. No evidence of bowel wall thickening, distention, or inflammatory changes. There is a large amount of stool throughout the colon. Lymphatic: No enlarged lymph nodes. Reproductive: Uterus and bilateral adnexa are unremarkable. Other: No abdominal wall hernia or abnormality. No abdominopelvic ascites. Musculoskeletal: No acute or significant osseous findings. IMPRESSION: 1. No evidence for mesenteric ischemia. 2. Decreased superior mesenteric artery distance measuring 4.6 mm. 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 NON-VASCULAR 1. No acute localizing process in the abdomen or pelvis. 2. Large amount of stool throughout the colon. Electronically Signed   By: Darliss Cheney M.D.   On: 03/23/2023 19:11        Scheduled Meds:  lamoTRIgine  200 mg Oral BID   multivitamin  15 mL Per Tube Daily   Norgestimate-Ethinyl Estradiol Triphasic  1 tablet Oral Q supper   sodium chloride flush  3 mL Intravenous Q12H   Continuous Infusions:  dextrose 100 mL/hr at 03/25/23 0749   feeding supplement ([employer] STANDARD 1.4) Stopped (03/24/23 1802)     LOS: 2 days    Time spent: 35 minutes    Dorcas Carrow, MD [facility]

## 2023-03-25 NOTE — Consult Note (Signed)
[facility] Face-to-Face Psychiatry Consult   Reason for Consult:  anxiety, weiight loss Referring Physician:  Dr. Jerral Ralph Patient Identification: Quynh Ruzicka MRN:  [id number] Principal Diagnosis: Postprandial epigastric pain Diagnosis:  Principal Problem:   Postprandial epigastric pain Active Problems:   Partial symptomatic epilepsy with complex partial seizures, not intractable, without status epilepticus (HCC)   Iron deficiency anemia   Protein-calorie malnutrition, severe   Total Time spent with patient: 1 hour  Subjective:   Philana Pedone is a 27 y.o. female patient admitted with postprandial pain. Due to her being overwhelmed with unrelentless relief and pain, patient was admitted to the hospital for possible feeding tube placement. She has developed anxiety in the setting of a chronic medical condition, and thus a psych consult was placed.   Patient is a 27 year old female who lives with her parents. She states her anxiety is only while in the hospital.  She denies any previous psychiatric conditions, mother verifies this. She reports a couple years ago she develop GI problems that has resulted in nausea. She states the nausea comes from eating or drinking anything. She has been unable to tolerate food for quite some time. She denies any history of eating disorder or recent diagnosis of such. She denies any restrictive eating behaviors, binge -eating, purging, or ever exercising. She further denies any inappropriate use of laxatives to aid in weight loss. SHe denies any body dysmorphia, or physical unattractiveness at this weight.   She is motivated and hopeful that she will actually gain her weight back, and does not like being this size. She states it took her 1 year to gain 8 lbs last year, and she has lost all the weight. She reports her biggest complaint is the nausea associated with thought provoking anxiety and medications. She reports her triggers are being in the  hospital, feeding tube, weight loss, and abdominal pain.   She denies any acute symptoms of mania at this time to include impulsivity, grandiosity, mood lability, hypersexuality.  She does not present with any of the above symptoms on this evaluation.  She further denies any depressive symptoms to include anhedonia, hopelessness, worthlessness, guilty, suicidal.  She denies any acute psychosis, paranoia.  She does not appear to be displaying any or responding to internal stimuli, external stimuli, or exhibiting delusional thought disorder.  Patient denies any access to weapons, denies any alcohol and or substance abuse.  She reports moderate sleep and poor appetite.  Her mother is present at the bedside, consent is obtained to speak with her. Mother identifies no new concerns at this time, safety concerns, or weight loss concerns. Mother requests as needed medication such as lorazepam as she is familiar with this medication and it works well and quickly. We did briefly review medications in the SNRI and TCA class that will be effective in managing this, however the patient/mother are seeking as needed medications. She reports her anxiety of this degree is in the hospital. " That is why me and dad are staying here with her overnight. She has never stayed in the hospital besides her foot surgery. " Patient denies any auditory and/or visual hallucinations, does not appear to be responding to internal or external stimuli.  There is no evidence of delusional thought content and patient appears to answer all questions appropriately.  At this time patient appears to have psychiatrically with support system services in place.    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 She does have chronic nausea and postprandial  epigastric pain, symptoms are recently worsening at least a month. 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 HIDA scan and up ultrasounds were negative. In the emergency room electrolytes are normal. No evidence of infection. Blood pressure remains soft.    Past Psychiatric History:   Risk to Self:   Denies Risk to Others:   Denies Prior Inpatient Therapy:   Denies Prior Outpatient Therapy:   Denies  Past Medical History:  Past Medical History:  Diagnosis Date   Avoidant or restrictive food intake disorder    Hx of foot surgery 2008   reconstruction- right    Seizures (HCC)    87 weeks old    Past Surgical History:  Procedure Laterality Date   FOOT SURGERY Right 2008   reconstruction   TYMPANOSTOMY TUBE PLACEMENT     Family History:  Family History  Problem Relation Age of Onset   Diabetes Mother    Multiple sclerosis Mother    Breast cancer Maternal Grandmother    Heart disease Maternal Grandfather    Breast cancer Paternal Grandmother    Seizures Neg Hx    Colon cancer Neg Hx    Stomach cancer Neg Hx    Esophageal cancer Neg Hx    Pancreatic cancer Neg Hx    Liver disease Neg Hx    Family Psychiatric  History: Denies Social History:  Social History   Substance and Sexual Activity  Alcohol Use No     Social History   Substance and Sexual Activity  Drug Use No    Social History   Socioeconomic History   Marital status: Single    Spouse name: Not on file   Number of children: 0   Years of education: 12   Highest education level: Not on file  Occupational History   Occupation: [employer]  Tobacco Use   Smoking status: Never   Smokeless tobacco: Never  Vaping Use   Vaping status: Never Used  Substance and Sexual Activity   Alcohol use: No   Drug use: No   Sexual activity: Not on file  Other Topics Concern   Not on file  Social History Narrative   Lives with parents    Caffeine use: daily iced tea, mostly water   Right handed   Social Determinants of Health   Financial Resource Strain: Not on file  Food Insecurity: No Food Insecurity (03/23/2023)   Hunger Vital Sign    Worried About Running Out of Food in the Last Year: Never true    Ran Out of Food in the Last Year: Never true  Transportation Needs: No Transportation Needs (03/23/2023)   PRAPARE - Administrator, Civil Service (Medical): No    Lack of Transportation (Non-Medical): No  Physical Activity: Not on file  Stress: Not on file  Social Connections: Not on file   Additional Social History:    Allergies:  No Known Allergies  Labs:  Results for orders placed or performed during the hospital encounter of 03/23/23 (from the past 48 hour(s))  Urinalysis, Routine w reflex microscopic -Urine, Clean Catch     Status: Abnormal   Collection Time: 03/23/23  3:53 PM  Result Value Ref Range   Color, Urine STRAW (A) YELLOW   APPearance CLEAR CLEAR   Specific Gravity, Urine 1.011  1.005 - 1.030   pH 8.0 5.0 - 8.0   Glucose, UA NEGATIVE NEGATIVE mg/dL   Hgb urine dipstick NEGATIVE NEGATIVE   Bilirubin Urine NEGATIVE NEGATIVE   Ketones, ur NEGATIVE NEGATIVE mg/dL   Protein, ur NEGATIVE NEGATIVE mg/dL   Nitrite NEGATIVE NEGATIVE   Leukocytes,Ua NEGATIVE NEGATIVE    Comment: Performed at [facility], [address] [place], [place] [address]  Comprehensive metabolic panel     Status: None   Collection Time: 03/23/23  4:32 PM  Result Value Ref Range   Sodium 136 135 - 145 mmol/L   Potassium 3.9 3.5 - 5.1 mmol/L   Chloride 103 98 - 111 mmol/L   CO2 27 22 - 32 mmol/L   Glucose, Bld 82 70 - 99 mg/dL    Comment: Glucose reference range applies only to samples taken after fasting for at least 8 hours.   BUN 7 6 - 20 mg/dL   Creatinine, Ser 0.27 0.44 - 1.00 mg/dL   Calcium 8.9 8.9 - 25.3 mg/dL   Total Protein 7.3 6.5 - 8.1 g/dL   Albumin 3.8 3.5 - 5.0 g/dL   AST 15  15 - 41 U/L   ALT 16 0 - 44 U/L   Alkaline Phosphatase 55 38 - 126 U/L   Total Bilirubin 0.6 0.3 - 1.2 mg/dL   GFR, Estimated >66 >44 mL/min    Comment: (NOTE) Calculated using the CKD-EPI Creatinine Equation (2021)    Anion gap 6 5 - 15    Comment: Performed at [facility], [address], [place], [place] [address]  Lipase, blood     Status: None   Collection Time: 03/23/23  4:32 PM  Result Value Ref Range   Lipase 36 11 - 51 U/L    Comment: Performed at [facility], [address] [place], [place] [address]  CBC with Diff     Status: Abnormal   Collection Time: 03/23/23  4:32 PM  Result Value Ref Range   WBC 5.7 4.0 - 10.5 K/uL   RBC 4.13 3.87 - 5.11 MIL/uL   Hemoglobin 11.6 (L) 12.0 - 15.0 g/dL   HCT 38.7 56.4 - 33.2 %   MCV 90.3 80.0 - 100.0 fL   MCH 28.1 26.0 - 34.0 pg   MCHC 31.1 30.0 - 36.0 g/dL   RDW 95.1 88.4 - 16.6 %   Platelets 285 150 - 400 K/uL   nRBC 0.0 0.0 - 0.2 %   Neutrophils Relative % 62 %   Neutro Abs 3.6 1.7 - 7.7 K/uL   Lymphocytes Relative 27 %   Lymphs Abs 1.5 0.7 - 4.0 K/uL   Monocytes Relative 9 %   Monocytes Absolute 0.5 0.1 - 1.0 K/uL   Eosinophils Relative 1 %   Eosinophils Absolute 0.1 0.0 - 0.5 K/uL   Basophils Relative 1 %   Basophils Absolute 0.0 0.0 - 0.1 K/uL   Immature Granulocytes 0 %   Abs Immature Granulocytes 0.01 0.00 - 0.07 K/uL    Comment: Performed at [facility], [address], [place], [place] [address]  hCG, serum, qualitative     Status: None   Collection Time: 03/23/23  4:32 PM  Result Value Ref Range   Preg, Serum NEGATIVE NEGATIVE    Comment:        THE SENSITIVITY OF THIS METHODOLOGY IS >10 mIU/mL. Performed at [facility], [address] [place], [place] [address]   HIV  Antibody (routine testing w rflx)     Status: None   Collection Time: 03/24/23  3:53 AM  Result Value Ref Range   HIV Screen 4th Generation wRfx Non Reactive Non  Reactive    Comment: Performed at [facility], [address], [place], [place] [address]  CBC     Status: Abnormal   Collection Time: 03/24/23  3:53 AM  Result Value Ref Range   WBC 6.0 4.0 - 10.5 K/uL   RBC 3.31 (L) 3.87 - 5.11 MIL/uL   Hemoglobin 9.4 (L) 12.0 - 15.0 g/dL   HCT 19.1 (L) 47.8 - 29.5 %   MCV 90.6 80.0 - 100.0 fL   MCH 28.4 26.0 - 34.0 pg   MCHC 31.3 30.0 - 36.0 g/dL   RDW 62.1 (H) 30.8 - 65.7 %   Platelets 235 150 - 400 K/uL   nRBC 0.0 0.0 - 0.2 %    Comment: Performed at [facility], [address] [place], [place] [address]  Basic metabolic panel     Status: Abnormal   Collection Time: 03/24/23  3:53 AM  Result Value Ref Range   Sodium 137 135 - 145 mmol/L   Potassium 3.8 3.5 - 5.1 mmol/L   Chloride 106 98 - 111 mmol/L   CO2 25 22 - 32 mmol/L   Glucose, Bld 79 70 - 99 mg/dL    Comment: Glucose reference range applies only to samples taken after fasting for at least 8 hours.   BUN 9 6 - 20 mg/dL   Creatinine, Ser 2.95 0.44 - 1.00 mg/dL   Calcium 8.4 (L) 8.9 - 10.3 mg/dL   GFR, Estimated >28 >41 mL/min    Comment: (NOTE) Calculated using the CKD-EPI Creatinine Equation (2021)    Anion gap 6 5 - 15    Comment: Performed at [facility], [address] [place], [place] [address]  Glucose, capillary     Status: None   Collection Time: 03/24/23  2:59 PM  Result Value Ref Range   Glucose-Capillary 84 70 - 99 mg/dL    Comment: Glucose reference range applies only to samples taken after fasting for at least 8 hours.  Magnesium     Status: None   Collection Time: 03/24/23  3:49 PM  Result Value Ref Range   Magnesium 2.0 1.7 - 2.4 mg/dL    Comment: Performed at [facility], [address], [place], [place] [address]  Phosphorus     Status: None   Collection Time: 03/24/23  3:49 PM  Result Value Ref Range   Phosphorus 4.3 2.5 - 4.6 mg/dL    Comment: Performed at [facility],  [address], [place], [place] [address]  Glucose, capillary     Status: Abnormal   Collection Time: 03/24/23  5:33 PM  Result Value Ref Range   Glucose-Capillary 49 (L) 70 - 99 mg/dL    Comment: Glucose reference range applies only to samples taken after fasting for at least 8 hours.  Glucose, capillary     Status: Abnormal   Collection Time: 03/24/23  7:10 PM  Result Value Ref Range   Glucose-Capillary 174 (H) 70 - 99 mg/dL    Comment: Glucose reference range applies only to samples taken after fasting for at least 8 hours.   Comment 1 Notify RN    Comment 2 Document in Chart   Glucose, capillary     Status: None   Collection Time: 03/25/23  12:11 AM  Result Value Ref Range   Glucose-Capillary 83 70 - 99 mg/dL    Comment: Glucose reference range applies only to samples taken after fasting for at least 8 hours.   Comment 1 Notify RN    Comment 2 Document in Chart   Glucose, capillary     Status: Abnormal   Collection Time: 03/25/23  4:50 AM  Result Value Ref Range   Glucose-Capillary 63 (L) 70 - 99 mg/dL    Comment: Glucose reference range applies only to samples taken after fasting for at least 8 hours.   Comment 1 Notify RN    Comment 2 Document in Chart   Glucose, capillary     Status: Abnormal   Collection Time: 03/25/23  5:19 AM  Result Value Ref Range   Glucose-Capillary 67 (L) 70 - 99 mg/dL    Comment: Glucose reference range applies only to samples taken after fasting for at least 8 hours.  Glucose, capillary     Status: Abnormal   Collection Time: 03/25/23  5:52 AM  Result Value Ref Range   Glucose-Capillary 176 (H) 70 - 99 mg/dL    Comment: Glucose reference range applies only to samples taken after fasting for at least 8 hours.  Glucose, capillary     Status: Abnormal   Collection Time: 03/25/23  7:19 AM  Result Value Ref Range   Glucose-Capillary 39 (LL) 70 - 99 mg/dL    Comment: Glucose reference range applies only to samples taken after fasting for at least 8  hours.   Comment 1 Notify RN    Comment 2 Document in Chart   Glucose, capillary     Status: Abnormal   Collection Time: 03/25/23  8:16 AM  Result Value Ref Range   Glucose-Capillary 182 (H) 70 - 99 mg/dL    Comment: Glucose reference range applies only to samples taken after fasting for at least 8 hours.   Comment 1 Notify RN   Vitamin B12     Status: None   Collection Time: 03/25/23  8:17 AM  Result Value Ref Range   Vitamin B-12 256 180 - 914 pg/mL    Comment: (NOTE) This assay is not validated for testing neonatal or myeloproliferative syndrome specimens for Vitamin B12 levels. Performed at [facility], [address] [place], [place] [address]   Folate     Status: Abnormal   Collection Time: 03/25/23  8:17 AM  Result Value Ref Range   Folate 4.7 (L) >5.9 ng/mL    Comment: Performed at [facility], [address], [place], [place] [address]  Magnesium     Status: None   Collection Time: 03/25/23  8:17 AM  Result Value Ref Range   Magnesium 1.8 1.7 - 2.4 mg/dL    Comment: Performed at [facility], [address], [place], [place] [address]  Phosphorus     Status: None   Collection Time: 03/25/23  8:17 AM  Result Value Ref Range   Phosphorus 2.7 2.5 - 4.6 mg/dL    Comment: Performed at [facility], [address] [place], [place] [address]  Glucose, random     Status: Abnormal   Collection Time: 03/25/23  8:17 AM  Result Value Ref Range   Glucose, Bld 191 (H) 70 - 99 mg/dL    Comment: Glucose reference range applies only to samples taken after fasting for at least 8 hours. Performed at [facility], [address] [place], [place] [address]     Current Facility-Administered Medications  Medication Dose Route Frequency Provider Last Rate Last Admin   acetaminophen (TYLENOL) tablet 650 mg  650 mg Oral Q6H PRN Charlsie Quest, MD   650 mg at 03/24/23 4098   Or    acetaminophen (TYLENOL) suppository 650 mg  650 mg Rectal Q6H PRN Charlsie Quest, MD       dextrose 10 % infusion   Intravenous Continuous Dorcas Carrow, MD 100 mL/hr at 03/25/23 0749 New Bag at 03/25/23 0749   feeding supplement (KATE FARMS STANDARD 1.4) liquid 240 mL  240 mL Per Tube Continuous Legrand Como, PA-C   Held at 03/24/23 1802   hydrOXYzine (ATARAX) tablet 10 mg  10 mg Oral TID PRN Maryagnes Amos, FNP   10 mg at 03/25/23 1000   lamoTRIgine (LAMICTAL XR) 24 hour tablet 200 mg  200 mg Oral BID Darreld Mclean R, MD   200 mg at 03/25/23 1191   multivitamin liquid 15 mL  15 mL Per Tube Daily McMichael, Bayley M, PA-C       Norgestimate-Ethinyl Estradiol Triphasic 0.18/0.215/0.25 MG-35 MCG tablet 1 tablet  1 tablet Oral Q supper Charlsie Quest, MD   1 tablet at 03/24/23 1801   ondansetron (ZOFRAN) tablet 4 mg  4 mg Oral Q6H PRN Charlsie Quest, MD       Or   ondansetron (ZOFRAN) injection 4 mg  4 mg Intravenous Q6H PRN Charlsie Quest, MD       senna-docusate (Senokot-S) tablet 1 tablet  1 tablet Oral QHS PRN Darreld Mclean R, MD       sodium chloride flush (NS) 0.9 % injection 3 mL  3 mL Intravenous Q12H Charlsie Quest, MD   3 mL at 03/24/23 2108    Musculoskeletal: Strength & Muscle Tone: within normal limits Gait & Station: normal Patient leans: N/A            Psychiatric Specialty Exam:  Presentation  General Appearance:  Appropriate for Environment; Casual  Eye Contact: Fair  Speech: Clear and Coherent; Slow  Speech Volume: Normal  Handedness: Right   Mood and Affect  Mood: Anxious  Affect: Appropriate; Congruent   Thought Process  Thought Processes: Coherent; Goal Directed; Linear  Descriptions of Associations:Intact  Orientation:Full (Time, Place and Person)  Thought Content:WDL  History of Schizophrenia/Schizoaffective disorder:No data recorded Duration of Psychotic Symptoms:No data  recorded Hallucinations:Hallucinations: None  Ideas of Reference:None  Suicidal Thoughts:Suicidal Thoughts: No  Homicidal Thoughts:Homicidal Thoughts: No   Sensorium  Memory: Immediate Fair; Recent Fair; Remote Fair  Judgment: Fair  Insight: Present   Executive Functions  Concentration: Good  Attention Span: Fair  Recall: Good  Fund of Knowledge: Good  Language: Fair   Psychomotor Activity  Psychomotor Activity: Psychomotor Activity: Normal   Assets  Assets: Communication Skills; Desire for Improvement; Financial Resources/Insurance; Social Support; Vocational/Educational; Housing   Sleep  Sleep: Sleep: Fair   Physical Exam: Physical Exam ROS Blood pressure (!) 92/59, pulse 86, temperature 98.7 F (37.1 C), temperature source Oral, resp. rate 17, height 5\' 2"  (1.575 m), weight 41.4 kg, last menstrual period 02/14/2023, SpO2 100%. Body mass index is 16.69 kg/m.  Treatment Plan Summary: Plan Will start Hydroxyzine 10mg  po TID prn, may increase to QID if effective. Patient may benefit from mirtazapine or TCA in the future, however this appears situational.  -Referral for CBT therapist or medical therapist. -Does not meet criteria for involuntary commitment.  -  No evidence of eating disorders.   Labs reviewed to include folate (4.7), B12 (256). Recommend daily supplementation for both at this time. Both have been linked to depression, cognitive impairment and weakness.    Psychiatry to sign off at this time.  Disposition: No evidence of imminent risk to self or others at present.   Patient does not meet criteria for psychiatric inpatient admission. Supportive therapy provided about ongoing stressors. Discussed crisis plan, support from social network, calling 911, coming to the Emergency Department, and calling Suicide Hotline.  Maryagnes Amos, FNP 03/25/2023 11:00 AM

## 2023-03-26 DIAGNOSIS — F419 Anxiety disorder, unspecified: Secondary | ICD-10-CM

## 2023-03-26 DIAGNOSIS — R1013 Epigastric pain: Secondary | ICD-10-CM | POA: Diagnosis not present

## 2023-03-26 DIAGNOSIS — R109 Unspecified abdominal pain: Secondary | ICD-10-CM

## 2023-03-26 DIAGNOSIS — K900 Celiac disease: Secondary | ICD-10-CM | POA: Diagnosis not present

## 2023-03-26 DIAGNOSIS — D509 Iron deficiency anemia, unspecified: Secondary | ICD-10-CM | POA: Diagnosis not present

## 2023-03-26 DIAGNOSIS — R634 Abnormal weight loss: Secondary | ICD-10-CM

## 2023-03-26 LAB — CORTISOL-AM, BLOOD: Cortisol - AM: 16.3 ug/dL (ref 6.7–22.6)

## 2023-03-26 LAB — GLUCOSE, CAPILLARY
Glucose-Capillary: 149 mg/dL — ABNORMAL HIGH (ref 70–99)
Glucose-Capillary: 42 mg/dL — CL (ref 70–99)
Glucose-Capillary: 49 mg/dL — ABNORMAL LOW (ref 70–99)
Glucose-Capillary: 54 mg/dL — ABNORMAL LOW (ref 70–99)
Glucose-Capillary: 65 mg/dL — ABNORMAL LOW (ref 70–99)
Glucose-Capillary: 72 mg/dL (ref 70–99)
Glucose-Capillary: 81 mg/dL (ref 70–99)
Glucose-Capillary: 88 mg/dL (ref 70–99)
Glucose-Capillary: 94 mg/dL (ref 70–99)

## 2023-03-26 LAB — MAGNESIUM: Magnesium: 1.9 mg/dL (ref 1.7–2.4)

## 2023-03-26 LAB — PHOSPHORUS: Phosphorus: 3.9 mg/dL (ref 2.5–4.6)

## 2023-03-26 MED ORDER — MIRTAZAPINE 15 MG PO TABS
7.5000 mg | ORAL_TABLET | Freq: Every day | ORAL | Status: DC
  Administered 2023-03-26: 7.5 mg via ORAL
  Filled 2023-03-26: qty 1

## 2023-03-26 MED ORDER — DEXTROSE 50 % IV SOLN
INTRAVENOUS | Status: AC
  Administered 2023-03-26: 50 mL
  Filled 2023-03-26: qty 50

## 2023-03-26 MED ORDER — ENSURE ENLIVE PO LIQD
237.0000 mL | Freq: Two times a day (BID) | ORAL | Status: DC
  Administered 2023-03-27: 237 mL via ORAL

## 2023-03-26 MED ORDER — HYDROMORPHONE HCL 1 MG/ML IJ SOLN: 0.5000 mg | Freq: Once | INTRAMUSCULAR | Status: DC

## 2023-03-26 MED ORDER — PROSIGHT PO TABS
1.0000 | ORAL_TABLET | Freq: Every day | ORAL | Status: DC
  Administered 2023-03-26 – 2023-03-29 (×4): 1 via ORAL
  Filled 2023-03-26 (×4): qty 1

## 2023-03-26 MED ORDER — LORAZEPAM 2 MG/ML IJ SOLN: 0.5000 mg | Freq: Once | INTRAMUSCULAR | Status: DC

## 2023-03-26 MED ORDER — LIDOCAINE HCL URETHRAL/MUCOSAL 2 % EX GEL
1.0000 | Freq: Once | CUTANEOUS | Status: DC
  Filled 2023-03-26: qty 5

## 2023-03-26 NOTE — Plan of Care (Signed)

## 2023-03-26 NOTE — Progress Notes (Signed)
Patient RUA IV assessed x2 by primary RN and IV RN, site clean dry intact. No edema erythema or ecchymosis. Pt request IV site to be removed.

## 2023-03-26 NOTE — Progress Notes (Signed)
CBG 49 this am. Discussed hypoglycemic protocol with hospitalist provider and will not proceed at this time. Per hospitalist will manage with oral intake. Patient encouraged to eat breakfast and to drink juice. Pt is receiving Dextrose 10 % 100 mL/hr.

## 2023-03-26 NOTE — Progress Notes (Signed)
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  She does have chronic nausea and postprandial epigastric pain, symptoms are recently worsening at least a month.  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  HIDA scan and ultrasounds were negative.  In the emergency room electrolytes are normal.  No evidence of infection.  Blood pressure remains soft. Episodes of hypoglycemia on fingerstick able on 10% dextrose.  Asymptomatic.  Assessment & Plan:   Postprandial abdominal pain associate with nausea and progressive weight loss Severe protein calorie malnutrition as evidenced by Nutrition Status: Nutrition Problem: Severe Malnutrition Etiology: chronic illness Signs/Symptoms: mild fat depletion, severe muscle depletion, percent weight loss (22% in 1 year) Percent weight loss: 22 % (in 1 year) Interventions: Refer to RD note for recommendations, MVI, Tube feeding Failure to thrive  Possibly from celiac disease and less likely SMA syndrome. On gluten-free diet.  Intermittent toleration of diet. Significant symptoms causing extreme debility and weight loss.  GI following.  NG tube feeding trial recommended, will try small bore Dobbhoff tube today. Close monitoring of electrolytes, vitamin levels.  Nutrition is following.  At risk of refeeding syndrome.   If she responds to postpyloric NG tube feeding, may benefit with J-tube placement if no improvement with conservative management. Seen by psychiatry, started on hydroxyzine and mirtazapine.  Seizure disorder: Stable on  Lamictal.  Hypoglycemia: Recurrent due to poor intake.  Keep on maintenance dextrose fluid.  Finger prick blood sugars were verified from random glucose and they were matching.  She does not have obvious symptoms with hypoglycemia.     DVT prophylaxis: SCDs Start: 03/23/23 2105   Code Status: Full code Family Communication: Mother at the bedside. Disposition Plan: Status is: Inpatient Remains inpatient appropriate because: Severely symptomatic weight loss, unable to eat.     Consultants:  Gastroenterology Psychiatry  Procedures:  None  Antimicrobials:  None   Subjective:  Patient seen and examined.  Remains on trace percent dextrose.  Patient tells me she ate about 75% of her dinner last night and whole bowl of cereal today morning.  Still has some discomfort but not to the extent that she will stop eating.  Denies any nausea or vomiting.  She was unable to try placement of Dobbhoff tube yesterday evening, she is agreeable to try with 1 dose of Dilaudid and Ativan to make her less anxious and with pain relief.  Objective: Vitals:   03/25/23 2000 03/26/23 0409 03/26/23 0953 03/26/23 1355  BP: 92/64 91/61 97/73  (!) 82/55  Pulse: 62 70 92 75  Resp: 16 16 18 16   Temp:  98.5 F (36.9 C)  98.4 F (36.9 C)  TempSrc:  Oral    SpO2:  99% 100% 95%  Weight:      Height:        Intake/Output Summary (Last 24 hours) at 03/26/2023 1444 Last data filed at 03/26/2023 0315 Gross per 24 hour  Intake 1541.57 ml  Output --  Net 1541.57 ml   Filed Weights   03/23/23 1409 03/25/23 0500  Weight: 35.4 kg 41.4 kg    Examination:  General: Looks comfortable at rest.  Anxious. Cachectic. Mucous membranes are moist.  Chest: Bilateral clear CVS: S1-S2 normal.  Regular rate rhythm. Abdomen: Mild tenderness along the epigastrium with no rigidity or guarding. Neuro: Alert awake and oriented.  No focal neurological deficits.  Data Reviewed: I have personally reviewed following labs  and imaging studies  CBC: Recent Labs  Lab 03/23/23 1632 03/24/23 0353 03/25/23 1916  WBC 5.7 6.0 6.4  NEUTROABS 3.6  --   --   HGB 11.6* 9.4* 11.7*  HCT 37.3 30.0* 37.0  MCV 90.3 90.6 91.8  PLT 285 235 283   Basic Metabolic Panel: Recent Labs  Lab 03/23/23 1632 03/24/23 0353 03/24/23 1549 03/25/23 0817 03/25/23 1215 03/25/23 1916 03/26/23 0629  NA 136 137  --   --   --   --   --   K 3.9 3.8  --   --   --   --   --   CL 103 106  --   --   --   --   --   CO2 27 25  --   --   --   --   --   GLUCOSE 82 79  --  191* 62*  --   --   BUN 7 9  --   --   --   --   --   CREATININE 0.77 0.80  --   --   --   --   --   CALCIUM 8.9 8.4*  --   --   --   --   --   MG  --   --  2.0 1.8  --  2.0 1.9  PHOS  --   --  4.3 2.7  --  3.7 3.9   GFR: Estimated Creatinine Clearance: 69 mL/min (by C-G formula based on SCr of 0.8 mg/dL). Liver Function Tests: Recent Labs  Lab 03/23/23 1632  AST 15  ALT 16  ALKPHOS 55  BILITOT 0.6  PROT 7.3  ALBUMIN 3.8   Recent Labs  Lab 03/23/23 1632  LIPASE 36   No results for input(s): "AMMONIA" in the last 168 hours. Coagulation Profile: No results for input(s): "INR", "PROTIME" in the last 168 hours. Cardiac Enzymes: No results for input(s): "CKTOTAL", "CKMB", "CKMBINDEX", "TROPONINI" in the last 168 hours. BNP (last 3 results) No results for input(s): "PROBNP" in the last 8760 hours. HbA1C: No results for input(s): "HGBA1C" in the last 72 hours. CBG: Recent Labs  Lab 03/25/23 2102 03/25/23 2321 03/26/23 0418 03/26/23 0738 03/26/23 1143  GLUCAP 82 81 81 49* 42*   Lipid Profile: No results for input(s): "CHOL", "HDL", "LDLCALC", "TRIG", "CHOLHDL", "LDLDIRECT" in the last 72 hours. Thyroid Function Tests: No results for input(s): "TSH", "T4TOTAL", "FREET4", "T3FREE", "THYROIDAB" in the last 72 hours. Anemia Panel: Recent Labs    03/25/23 0817 03/25/23 1215  VITAMINB12 256  --   FOLATE 4.7*  --   TIBC  --  437  IRON  --  134    Sepsis Labs: No results for input(s): "PROCALCITON", "LATICACIDVEN" in the last 168 hours.  No results found for this or any previous visit (from the past 240 hour(s)).       Radiology Studies: No results found.      Scheduled Meds:   HYDROmorphone (DILAUDID) injection  0.5 mg Intravenous Once   lamoTRIgine  200 mg Oral BID   lidocaine  1 Application Topical Once   LORazepam  0.5 mg Intravenous Once   multivitamin  15 mL Per Tube Daily   Norgestimate-Ethinyl Estradiol  Triphasic  1 tablet Oral Q supper   pantoprazole  40 mg Oral QAC breakfast   sodium chloride flush  3 mL Intravenous Q12H   Continuous Infusions:  dextrose 100 mL/hr at 03/26/23 0647   feeding supplement (KATE FARMS STANDARD 1.4) Stopped (03/24/23 1802)     LOS: 3 days    Time spent: 35 minutes    Dorcas Carrow, MD [facility]

## 2023-03-26 NOTE — Progress Notes (Signed)
Hypoglycemic Event  CBG: 57  Treatment: 8 oz  apple juice  Symptoms: None  Follow-up CBG: Time:2104  CBG Result: 82  Possible Reasons for Event: Unknown. Patient had ate 75% of dinner.  Comments/MD notified: Yes. Patient/family requested that D50 via IV be used as last results, d/t patient states it makes her feel bad.    [name]

## 2023-03-26 NOTE — Progress Notes (Addendum)
Daily Progress Note  DOA: 03/23/2023 Hospital Day: 4  Chief Complaint: abdominal pain / malnutrition  ASSESSMENT & PLAN   Brief Narrative:  Martha Wells is a 27 y.o. 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 Admitted with failure to thrive.   Chronic, unexplained postprandial upper abdominal pain / malnutrition  Unrevealing extensive workup including HIDA, EGD, CT scan  and labs. PPIs and Carafate do not help. She has celiac disease and elevated antibodies in July suggest she hasn't been completely gluten free. CT angio this admission suggested superior mesenteric artery syndrome but wasn't conclusive.  --SMA syndrome is not a clearcut entity. Anatomic changes are likely from weight loss and  may improve with weight gain. --Do not think vascular surgery consult needed at this point  --Small bowel feeding tube placement wasn't successful. Another attempt is supposed to be made at some point today --She had cereal this am and sounds like she was tolerating dinner last night before tray was taken so feeding tube could be placed. She is unable to tell me why she thinks she is tolerating PO better now than when at home.  --Dietician to see and recommend oral supplements --Psychiatry evaluated yesterday.They agreed she may benefit from mirtazapine or TCA in future but right now felt anxiety was situational ( related to being hospitalized) and she was started on Hydroxyzine as needed. --Consider buspirone, possible duloxetine at some point ( helps anxiety and functional dyspepsia),  --Continue PPI  Anxiety -Hydroxyzine per Psych  Celiac disease  Iron deficiency anemia --To see Hematology for IV iron  ----------------------------------------------------------------------------------------------------    [facility] Attending   I have taken an interval history, reviewed the chart and examined  the patient. 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 She did not tolerate nasal tube. Will stop trying that.  Tearful when I came but better. This started after conversation between RD, mom and patient.  Pantoprazole made her shaky and dizzy last night and does not want to take further. DC  I am starting mirtazapine tonight. Will see - it promotes sleep and appetite and reduces nausea. May take some time. Though psych says no eating disorder I do believe there are mental health issues in play here.  She has been eating better. Hopefully will continue.  Iva Boop, MD, [facility] See Loretha Stapler on call - gastroenterology for best contact person 03/26/2023 3:34 PM   Subjective   Tolerated cereal this am without pain.    Objective    Recent Labs    03/23/23 1632 03/24/23 0353 03/25/23 1916  WBC 5.7 6.0 6.4  HGB 11.6* 9.4* 11.7*  HCT 37.3 30.0* 37.0  PLT 285 235 283   BMET Recent Labs    03/23/23 1632 03/24/23 0353 03/25/23 0817 03/25/23 1215  NA 136 137  --   --   K 3.9 3.8  --   --   CL 103 106  --   --   CO2 27 25  --   --   GLUCOSE 82 79 191* 62*  BUN 7 9  --   --   CREATININE 0.77 0.80  --   --   CALCIUM 8.9 8.4*  --   --  LFT Recent Labs    03/23/23 1632  PROT 7.3  ALBUMIN 3.8  AST 15  ALT 16  ALKPHOS 55  BILITOT 0.6   PT/INR  Imaging:  CT Angio Abd/Pel W and/or Wo Contrast CLINICAL DATA:  Evaluate for mesenteric ischemia. Abdominal pain and nausea.  EXAM: CTA ABDOMEN AND PELVIS WITHOUT AND WITH CONTRAST  TECHNIQUE: Multidetector CT imaging of the abdomen and pelvis was performed using the standard protocol during bolus administration of intravenous  contrast. Multiplanar reconstructed images and MIPs were obtained and reviewed to evaluate the vascular anatomy.  RADIATION DOSE REDUCTION: This exam was performed according to the departmental dose-optimization program which includes automated exposure control, adjustment of the mA and/or kV according to patient size and/or use of iterative reconstruction technique.  CONTRAST:  OMNIPAQUE IOHEXOL 350 MG/ML SOLN  COMPARISON:  CT abdomen and pelvis 01/15/2022  FINDINGS: VASCULAR  Aorta: Normal caliber aorta without aneurysm, dissection, vasculitis or significant stenosis.  Celiac: Patent without evidence of aneurysm, dissection, vasculitis or significant stenosis.  SMA: Patent without evidence of aneurysm, dissection, vasculitis or significant stenosis. Superior mesenteric artery distance is decreased measuring 4.6 mm. Angle is not well measured secondary to positioning of the artery.  Renals: Both renal arteries are patent without evidence of aneurysm, dissection, vasculitis, fibromuscular dysplasia or significant stenosis.  IMA: Patent without evidence of aneurysm, dissection, vasculitis or significant stenosis.  Inflow: Patent without evidence of aneurysm, dissection, vasculitis or significant stenosis.  Proximal Outflow: Bilateral common femoral and visualized portions of the superficial and profunda femoral arteries are patent without evidence of aneurysm, dissection, vasculitis or significant stenosis.  Veins: No acute abnormality.  Review of the MIP images confirms the above findings.  NON-VASCULAR  Lower chest: No acute abnormality.  Hepatobiliary: No focal liver abnormality is seen. No gallstones, gallbladder wall thickening, or biliary dilatation.  Pancreas: Unremarkable. No pancreatic ductal dilatation or surrounding inflammatory changes.  Spleen: Normal in size without focal abnormality.  Adrenals/Urinary Tract: Adrenal glands are unremarkable.  Kidneys are normal, without renal calculi, focal lesion, or hydronephrosis. Bladder is unremarkable.  Stomach/Bowel: Stomach is within normal limits. Appendix is not seen. No evidence of bowel wall thickening, distention, or inflammatory changes. There is a large amount of stool throughout the colon.  Lymphatic: No enlarged lymph nodes.  Reproductive: Uterus and bilateral adnexa are unremarkable.  Other: No abdominal wall hernia or abnormality. No abdominopelvic ascites.  Musculoskeletal: No acute or significant osseous findings.  IMPRESSION: 1. No evidence for mesenteric ischemia. 2. Decreased superior mesenteric artery distance measuring 4.6 mm. 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  NON-VASCULAR  1. No acute localizing process in the abdomen or pelvis. 2. Large amount of stool throughout the colon.  Electronically Signed   By: Darliss Cheney M.D.   On: 03/23/2023 19:11     Scheduled inpatient medications:   lamoTRIgine  200 mg Oral BID   multivitamin  15 mL Per Tube Daily   Norgestimate-Ethinyl Estradiol Triphasic  1 tablet Oral Q supper   pantoprazole  40 mg Oral QAC breakfast   sodium chloride flush  3 mL Intravenous Q12H   Continuous inpatient infusions:   dextrose 100 mL/hr at 03/26/23 0647   feeding supplement (KATE FARMS STANDARD 1.4) Stopped (03/24/23 1802)   PRN inpatient medications: acetaminophen **OR** acetaminophen, hydrOXYzine, ondansetron **OR** ondansetron (ZOFRAN) IV, senna-docusate  Vital signs in last 24 hours: Temp:  [98.5 F (36.9 C)-98.7 F (37.1 C)] 98.5 F (36.9 C) (08/21 0409)  Pulse Rate:  [62-97] 70 (08/21 0409) Resp:  [16-17] 16 (08/21 0409) BP: (91-95)/(58-64) 91/61 (08/21 0409) SpO2:  [99 %-100 %] 99 % (08/21 0409) Last BM Date : 03/25/23  Intake/Output Summary (Last 24 hours) at 03/26/2023 0946 Last data filed at 03/26/2023 0315 Gross per 24 hour  Intake 1661.57 ml  Output --  Net 1661.57 ml     Intake/Output from previous day: 08/20 0701 - 08/21 0700 In: 1661.6 [P.O.:120; I.V.:1541.6] Out: -  Intake/Output this shift: No intake/output data recorded.   Physical Exam:  General: Alert thin female in NAD Heart:  Regular rate and rhythm.  Pulmonary: Normal respiratory effort Abdomen: Soft, nondistended, nontender. Normal bowel sounds. Extremities: No lower extremity edema  Neurologic: Alert and oriented Psych: Pleasant. Cooperative. Insight appears normal.    Principal Problem:   Postprandial epigastric pain Active Problems:   Partial symptomatic epilepsy with complex partial seizures, not intractable, without status epilepticus (HCC)   Iron deficiency anemia   Protein-calorie malnutrition, severe   Celiac disease   Failure to thrive in adult   Functional dyspepsia     LOS: 3 days   Willette Cluster ,NP 03/26/2023, 9:46 AM

## 2023-03-26 NOTE — Progress Notes (Signed)
The patients CBG was 65 this afternoon. The pt complained of feeling shaky, dizzy. Upon assessment pt is diaphoretic, CBG 54. Hospitlaist contacted V.O received to administer to administer Dextrose 10 % IV , 100 cc bolus.

## 2023-03-26 NOTE — Progress Notes (Signed)
Hypoglycemic Event  CBG: 0450=63, 0519=67, 0552=176  Treatment: 8 oz juice given for BS =63 at 0450, 0519 BS rechecked=67, then D50 via IV given 0552 BS=176  Symptoms: Pale, Sweaty, and Shaky  Follow-up CBG: Time:0519 CBG Result: 67  Possible Reasons for Event: Unknown  Comments/MD notified: Martha Capers NP was made aware of above activities and he gave order to give D50 via IV.     Martha Wells

## 2023-03-27 DIAGNOSIS — R1013 Epigastric pain: Secondary | ICD-10-CM | POA: Diagnosis not present

## 2023-03-27 LAB — GLUCOSE, CAPILLARY
Glucose-Capillary: 84 mg/dL (ref 70–99)
Glucose-Capillary: 80 mg/dL (ref 70–99)
Glucose-Capillary: 78 mg/dL (ref 70–99)
Glucose-Capillary: 75 mg/dL (ref 70–99)
Glucose-Capillary: 86 mg/dL (ref 70–99)
Glucose-Capillary: 228 mg/dL — ABNORMAL HIGH (ref 70–99)
Glucose-Capillary: 26 mg/dL — CL (ref 70–99)
Glucose-Capillary: 30 mg/dL — CL (ref 70–99)

## 2023-03-27 LAB — VITAMIN B1: Vitamin B1 (Thiamine): 90 nmol/L (ref 66.5–200.0)

## 2023-03-27 MED ORDER — DEXTROSE 50 % IV SOLN
1.0000 | INTRAVENOUS | Status: AC
  Administered 2023-03-27: 50 mL via INTRAVENOUS
  Filled 2023-03-27: qty 50

## 2023-03-27 MED ORDER — ONDANSETRON HCL 4 MG/2ML IJ SOLN
4.0000 mg | Freq: Three times a day (TID) | INTRAMUSCULAR | Status: DC | PRN
  Administered 2023-03-27 – 2023-03-28 (×2): 4 mg via INTRAVENOUS
  Filled 2023-03-27 (×2): qty 2

## 2023-03-27 MED ORDER — ONDANSETRON 4 MG PO TBDP
4.0000 mg | ORAL_TABLET | Freq: Three times a day (TID) | ORAL | Status: DC
  Administered 2023-03-27 – 2023-03-29 (×5): 4 mg via ORAL
  Filled 2023-03-27 (×6): qty 1

## 2023-03-27 MED ORDER — ENSURE ENLIVE PO LIQD
237.0000 mL | Freq: Three times a day (TID) | ORAL | Status: DC
  Administered 2023-03-27 – 2023-03-29 (×4): 237 mL via ORAL

## 2023-03-27 NOTE — Progress Notes (Addendum)
Calorie Count Note  48 hour calorie count ordered.  Diet: Gluten free Supplements: Ensure Plus High Protein po TID, each supplement provides 350 kcal and 20 grams of protein.   Day 1 Breakfast 8/22: 250 kcals, 11g protein Lunch 8/21: 375 kcals, 12g protein Dinner 8/21: 204 kcals, 14g protein Snacks: 230 kcals, 3g protein  Supplements: none so far  Total intake: 1059 kcal (75% of minimum estimated needs)  40g protein (66% of minimum estimated needs)  Patient will need to consume protein supplements to help meet needs. Per RN, pt won't drink Molli Posey because it is vanilla. Ensure is gluten-free and lactose free so pt should be able to tolerate, will monitor. Pt's family has also brought plant based protein shake to consume as well that is chocolate.   Nutrition Dx: Severe Malnutrition related to chronic illness as evidenced by mild fat depletion, severe muscle depletion, percent weight loss (22% in 1 year).   Goal: Pt to meet >/= 90% of their estimated nutrition needs   Intervention:  -Continue Calorie Count -If unable to meet needs via PO, consider G or J-tube placement.  -Multivitamin with minerals daily -Recommend folic acid supplementation given low folate levels. -Vitamin B-6 and Thiamine labs still pending  Tilda Franco, MS, RD, LDN Inpatient Clinical Dietitian Contact information available via Amion

## 2023-03-27 NOTE — Progress Notes (Signed)
Nutrition Brief Note  Visited patient at bedside whose mother was at present. Patient reports she has been eating well today and that she ate over 50% of her breakfast and lunch. RD observed snacks such as raisins, cashews and gluten free cookies at bedside. Patient reports she is hungry and wants to eat but cannot handle the postprandial pain/nausea/discomfort and early satiety sometimes. She reports snacking on items at bedside and reports her father is bringing her chocolate ONS from home. RD ordered chocolate Ensure Plus High Protein po BID, each supplement provides 350 kcal and 20 grams of protein, since she likes these. Apparently she asked for them last night and was told they were out of stock?   Patient's mother reports family history of deviated septums which could possibly be why patient's NG placement was not successful. Patient's mom often talked over patient and RD while trying to share information. Patient was getting emotional towards the end of interview because she thinks she could benefit from a PEG and wants to be strong enough to undergo surgery but her mother thinks she has a low tolerance for pain and will not make progress after potential PEG placement.   RD encourage patient to eat high calorie, high protein meals and snacks as we count her calories over the next couple days. She was agreeable and motivated to do her best. Patient often felt accused throughout interview and RD frequently reminded her that this is a safe space and she is not being accused of purposefully not wanting to eat.   RD spoke with RN and GI MD about nutrition plans after visit with patient.   Calorie count has started and will be ongoing for the next 48hrs.   Leodis Rains, RDN, LDN  Clinical Nutrition

## 2023-03-27 NOTE — Progress Notes (Signed)
    Patient Name: Polina Mcguffie Huckeby           DOB: 07-22-1996  MRN: [id number]      Admission Date: 03/23/2023  Attending Provider: Dorcas Carrow, MD  Primary Diagnosis: Postprandial epigastric pain   Level of care: Med-Surg    CROSS COVER NOTE   Date of Service   03/27/2023   Kristiana Bowerman Londo, 27 y.o. female, was admitted on 03/23/2023 for Postprandial epigastric pain.    HPI/Events of Note   Hypoglycemic episode, CBG 30 and 26 on recheck. Nursing staff reports patient went for a bath and was off D10 gtt. for 20 minutes Patient is alert and sipping on juice. Order placed for D50 amp.  Upon recheck, CBG 228.  Continue D10 gtt. and every 4 CBGs.   Interventions/ Plan   Above        Anthoney Harada, DNP, [facility] [place]

## 2023-03-27 NOTE — Plan of Care (Signed)

## 2023-03-27 NOTE — Progress Notes (Signed)
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  She does have chronic nausea and postprandial epigastric pain, symptoms are recently worsening at least a month.  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  HIDA scan and ultrasounds were negative.  In the emergency room electrolytes are normal.  No evidence of infection.  Blood pressure remains soft. Episodes of hypoglycemia on fingerstick still on 10% dextrose.   Intolerance to multiple medications. Remains in the hospital due to poor appetite.  Multiple modality of treatment started.  Assessment & Plan:   Postprandial abdominal pain associate with nausea and progressive weight loss Failure to thrive Celiac disease Severe protein calorie malnutrition as evidenced by Nutrition Status: Nutrition Problem: Severe Malnutrition Etiology: chronic illness Signs/Symptoms: mild fat depletion, severe muscle depletion, percent weight loss (22% in 1 year) Percent weight loss: 22 % (in 1 year) Interventions: Refer to RD note for recommendations, MVI, Tube feeding   Possibly from celiac disease and not likely from SMA syndrome. On gluten-free diet.  Tolerating a small frequency of meals today. Significant symptoms causing extreme debility and weight loss.  GI following.  Did not tolerate NG tube insertion. Close monitoring of electrolytes, vitamin levels.  Nutrition is following. Did not tolerate Protonix and Remeron. -Continue gluten-free diet.  Food diary.  Calorie count. -Scheduled Zofran before meals today.  Some clinical  response today.  Seizure disorder: Stable on Lamictal.  Hypoglycemia: Recurrent due to poor intake.  Keep on maintenance dextrose fluid.  No glycogen storage. She had multiple abdominal imaging not suggesting of any evidence of intra-abdominal mass or insulinoma.    DVT prophylaxis: SCDs Start: 03/23/23 2105   Code Status: Full code Family Communication: Father at the bedside. Disposition Plan: Status is: Inpatient Remains inpatient appropriate because: Severely symptomatic weight loss, unable to eat.     Consultants:  Gastroenterology Psychiatry  Procedures:  None  Antimicrobials:  None   Subjective:  Patient seen and examined.  Father was at the bedside.  Patient was anxious.  Had overnight event she had dizziness and almost passed out in the bathroom.  Patient thinks Remeron might have caused the symptoms.  Will discontinue. Still has some discomfort but her postprandial pain has Gotten much better. Wants to try Zofran before meals to see if that helps.  Calorie count ongoing.  She did eat all of cereal and protein milk today.  Objective: Vitals:   03/27/23 0224 03/27/23 0617 03/27/23 0713 03/27/23 1134  BP: (!) 87/60 100/66  98/68  Pulse: 69 93  81  Resp: 14 16  16   Temp: 98.5 F (36.9 C) 97.8 F (36.6 C)  98.5 F (36.9 C)  TempSrc:  Oral  Oral  SpO2: 100% 100%  (!) 84%  Weight:   36 kg   Height:        Intake/Output Summary (Last 24 hours) at 03/27/2023 1301 Last data filed at 03/26/2023 1828 Gross per 24 hour  Intake 150 ml  Output --  Net 150 ml   Filed Weights   03/23/23 1409 03/25/23 0500 03/27/23 0713  Weight: 35.4 kg 41.4 kg 36 kg    Examination:  General: Anxious but comfortable. Cachectic.  Mucous membranes are moist. Chest: Bilateral clear CVS: S1-S2 normal.  Regular rate rhythm. Abdomen: Mild tenderness along the epigastrium with no rigidity or guarding. Neuro: Alert awake and oriented.  No focal neurological deficits.  Data  Reviewed: I have personally reviewed following labs and imaging studies  CBC: Recent Labs  Lab 03/23/23 1632 03/24/23 0353 03/25/23 1916  WBC 5.7 6.0 6.4  NEUTROABS 3.6  --   --   HGB 11.6* 9.4* 11.7*  HCT 37.3 30.0* 37.0  MCV 90.3 90.6 91.8  PLT 285 235 283   Basic Metabolic Panel: Recent Labs  Lab 03/23/23 1632 03/24/23 0353 03/24/23 1549 03/25/23 0817 03/25/23 1215 03/25/23 1916 03/26/23 0629  NA 136 137  --   --   --   --   --   K 3.9 3.8  --   --   --   --   --   CL 103 106  --   --   --   --   --   CO2 27 25  --   --   --   --   --   GLUCOSE 82 79  --  191* 62*  --   --   BUN 7 9  --   --   --   --   --   CREATININE 0.77 0.80  --   --   --   --   --   CALCIUM 8.9 8.4*  --   --   --   --   --   MG  --   --  2.0 1.8  --  2.0 1.9  PHOS  --   --  4.3 2.7  --  3.7 3.9   GFR: Estimated Creatinine Clearance: 60 mL/min (by C-G formula based on SCr of 0.8 mg/dL). Liver Function Tests: Recent Labs  Lab 03/23/23 1632  AST 15  ALT 16  ALKPHOS 55  BILITOT 0.6  PROT 7.3  ALBUMIN 3.8   Recent Labs  Lab 03/23/23 1632  LIPASE 36   No results for input(s): "AMMONIA" in the last 168 hours. Coagulation Profile: No results for input(s): "INR", "PROTIME" in the last 168 hours. Cardiac Enzymes: No results for input(s): "CKTOTAL", "CKMB", "CKMBINDEX", "TROPONINI" in the last 168 hours. BNP (last 3 results) No results for input(s): "PROBNP" in the last 8760 hours. HbA1C: No results for input(s): "HGBA1C" in the last 72 hours. CBG: Recent Labs  Lab 03/26/23 2351 03/27/23 0233 03/27/23 0614 03/27/23 0738 03/27/23 1136  GLUCAP 88 84 80 78 75   Lipid Profile: No results for input(s): "CHOL", "HDL", "LDLCALC", "TRIG", "CHOLHDL", "LDLDIRECT" in the last 72 hours. Thyroid Function Tests: No results for input(s): "TSH", "T4TOTAL", "FREET4", "T3FREE", "THYROIDAB" in the last 72 hours. Anemia Panel: Recent Labs    03/25/23 0817 03/25/23 1215  VITAMINB12 256  --    FOLATE 4.7*  --   TIBC  --  437  IRON  --  134   Sepsis Labs: No results for input(s): "PROCALCITON", "LATICACIDVEN" in the last 168 hours.  No results found for this or any previous visit (from the past 240 hour(s)).       Radiology Studies: No results found.      Scheduled Meds:  feeding supplement  237 mL Oral BID BM   lamoTRIgine  200 mg Oral BID   multivitamin  1 tablet Oral Daily   Norgestimate-Ethinyl Estradiol Triphasic  1 tablet Oral Q supper   ondansetron  4 mg Oral  TID AC   sodium chloride flush  3 mL Intravenous Q12H   Continuous Infusions:  dextrose 100 mL/hr at 03/27/23 1206   feeding supplement (KATE FARMS STANDARD 1.4) Stopped (03/24/23 1802)     LOS: 4 days    Time spent: 35 minutes    Dorcas Carrow, MD [facility]

## 2023-03-27 NOTE — Progress Notes (Addendum)
Daily Progress Note  DOA: 03/23/2023 Hospital Day: 5  Chief Complaint:  chronic postprandial abdominal pain / malnutrition   ASSESSMENT & PLAN   Brief Narrative:  Martha Wells is a 27 y.o. 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 Admitted with failure to thrive   Chronic, unexplained postprandial upper abdominal pain / malnutrition  Unrevealing extensive workup  of abdominal pain including HIDA, EGD, CT scan  and labs.  CT angio this admission suggested superior mesenteric artery syndrome but wasn't conclusive.  --SMA syndrome is not a clearcut entity. Anatomic changes are likely from weight loss and  may improve with weight gain. --Do not think vascular surgery consult needed at this point  --Small bowel feeding tube placement wasn't successful but has been tolerating more PO in the hospital than at home, ? Why. Appreciate Dietician's assistance. Calorie count in progress. PO supplements ordered.  --Started on Remeron last evening, ? Had reaction. Complains of weird feeling since starting med and sounds like a near syncopal episode during the night. Remeron has been discontinued --Consider buspirone, possible duloxetine at some point ( helps anxiety and functional dyspepsia),  --Continue PPI  Hypoglycemia  Anxiety -Hydroxyzine per Psych   Celiac disease   Iron deficiency anemia --To see Hematology for IV iron  -------------------------------------------------------------------------------------------------------   [facility] Attending   I have taken an interval history, reviewed the chart and examined the patient. 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 Possible reaction to mirtazapine.  Continue oral intake and calorie count. Agree w/ Zofran qAC  I'm not going to suggest any other rx therapy given possible side effcts.  We will f/u next couple of days.  I do think there is mental health overlay.  She could need palliative care consult but defer to [facility].  Iva Boop, MD, [facility] See Loretha Stapler on call - gastroenterology for best contact person 03/27/2023 5:53 PM  Subjective   Sleepy . Didn't sleep much last night due to near syncopal episode.   Father in room.    Objective    Recent Labs    03/25/23 1916  WBC 6.4  HGB 11.7*  HCT 37.0  PLT 283   BMET Recent Labs    03/25/23 0817 03/25/23 1215  GLUCOSE 191* 62*   LFT No results for input(s): "PROT", "ALBUMIN", "AST", "ALT", "ALKPHOS", "BILITOT", "BILIDIR", "IBILI" in the last 72 hours. PT/INR No results for input(s): "LABPROT", "INR" in the last 72 hours.   Imaging:  CT Angio Abd/Pel W and/or Wo Contrast CLINICAL DATA:  Evaluate for mesenteric ischemia. Abdominal pain and nausea.  EXAM: CTA ABDOMEN AND PELVIS WITHOUT AND WITH CONTRAST  TECHNIQUE: Multidetector CT imaging of the abdomen and pelvis was performed using the standard protocol during bolus administration of intravenous contrast. Multiplanar reconstructed images and MIPs were obtained and reviewed to evaluate the vascular anatomy.  RADIATION DOSE REDUCTION: This exam was performed according to the departmental dose-optimization program which includes automated exposure control, adjustment of the mA and/or kV according to patient size and/or use of iterative reconstruction technique.  CONTRAST:  OMNIPAQUE IOHEXOL 350 MG/ML SOLN  COMPARISON:  CT abdomen and pelvis 01/15/2022  FINDINGS: VASCULAR  Aorta: Normal caliber aorta without aneurysm, dissection, vasculitis or significant stenosis.  Celiac: Patent without  evidence of aneurysm, dissection, vasculitis or significant stenosis.  SMA: Patent without evidence of aneurysm, dissection, vasculitis or significant stenosis. Superior mesenteric artery distance is decreased measuring 4.6 mm. Angle is not well measured secondary to positioning of the artery.  Renals: Both renal arteries are patent without evidence of aneurysm, dissection, vasculitis, fibromuscular dysplasia or significant stenosis.  IMA: Patent without evidence of aneurysm, dissection, vasculitis or significant stenosis.  Inflow: Patent without evidence of aneurysm, dissection, vasculitis or significant stenosis.  Proximal Outflow: Bilateral common femoral and visualized portions of the superficial and profunda femoral arteries are patent without evidence of aneurysm, dissection, vasculitis or significant stenosis.  Veins: No acute abnormality.  Review of the MIP images confirms the above findings.  NON-VASCULAR  Lower chest: No acute abnormality.  Hepatobiliary: No focal liver abnormality is seen. No gallstones, gallbladder wall thickening, or biliary dilatation.  Pancreas: Unremarkable. No pancreatic ductal dilatation or surrounding inflammatory changes.  Spleen: Normal in size without focal abnormality.  Adrenals/Urinary Tract: Adrenal glands are unremarkable. Kidneys are normal, without renal calculi, focal lesion, or hydronephrosis. Bladder is unremarkable.  Stomach/Bowel: Stomach is within normal limits. Appendix is not seen. No evidence of bowel wall thickening, distention, or inflammatory changes. There is a large amount of stool throughout the colon.  Lymphatic: No enlarged lymph nodes.  Reproductive: Uterus and bilateral adnexa are unremarkable.  Other: No abdominal wall hernia or abnormality. No abdominopelvic ascites.  Musculoskeletal: No acute or significant osseous findings.  IMPRESSION: 1. No evidence for mesenteric ischemia. 2. Decreased  superior mesenteric artery distance measuring 4.6 mm. 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  NON-VASCULAR  1. No acute localizing process in the abdomen or pelvis. 2. Large amount of stool throughout the colon.  Electronically Signed   By: Darliss Cheney M.D.   On: 03/23/2023 19:11     Scheduled inpatient medications:   feeding supplement  237 mL Oral TID BM   lamoTRIgine  200 mg Oral BID   multivitamin  1 tablet Oral Daily   Norgestimate-Ethinyl Estradiol Triphasic  1 tablet Oral Q supper   ondansetron  4 mg Oral TID AC   sodium chloride flush  3 mL Intravenous Q12H   Continuous inpatient infusions:   dextrose 100 mL/hr at 03/27/23 1206   PRN inpatient medications: acetaminophen **OR** acetaminophen, hydrOXYzine, ondansetron (ZOFRAN) IV, senna-docusate  Vital signs in last 24 hours: Temp:  [97.8 F (36.6 C)-98.5 F (36.9 C)] 98.5 F (36.9 C) (08/22 1134) Pulse Rate:  [69-93] 81 (08/22 1134) Resp:  [14-16] 16 (08/22 1134) BP: (87-100)/(60-68) 98/68 (08/22 1134) SpO2:  [84 %-100 %] 84 % (08/22 1134) Weight:  [36 kg] 36 kg (08/22 0713) Last BM Date : 03/25/23  Intake/Output Summary (Last 24 hours) at 03/27/2023 1421 Last data filed at 03/26/2023 1828 Gross per 24 hour  Intake 150 ml  Output --  Net 150 ml    Intake/Output from previous day: 08/21 0701 - 08/22 0700 In: 150 [P.O.:150] Out: -  Intake/Output this shift: No intake/output data recorded.   Physical Exam:  General: Alert female in NAD Neurologic: Alert and oriented Psych: Pleasant. Cooperative. Insight appears normal.    Principal Problem:   Postprandial epigastric pain Active Problems:   Partial symptomatic epilepsy with complex partial seizures, not intractable, without status epilepticus (HCC)   Iron deficiency anemia   Protein-calorie malnutrition, severe   Celiac disease  Failure to thrive in adult   Functional dyspepsia   Excessive weight loss      LOS: 4 days   Willette Cluster ,NP 03/27/2023, 2:21 PM

## 2023-03-27 NOTE — Progress Notes (Signed)
Called to patient's room by her father, upon arriving to room patient was laying on her back on the bathroom floor. Her father stated that he laid her down there due to her looking like she was [place] pass out when he was trying to help her stand from toilet. He stated that she called out for him and said she didn't feel well and that's when he went into bathroom, when he got in front of her it appeared as though she passed out and he caught her. Patient was flushed and pale in color. She stated that she had been feeling bad/weird since she had taken her new pill Remeron 7.5 mg last night. I obtained her vital signs 87/60-98.5-69-14-100%/RA, blood sugar was 84. She also complained of abdominal pain and requested Zofran.

## 2023-03-28 DIAGNOSIS — R1013 Epigastric pain: Secondary | ICD-10-CM | POA: Diagnosis not present

## 2023-03-28 LAB — COMPREHENSIVE METABOLIC PANEL
ALT: 15 U/L (ref 0–44)
AST: 14 U/L — ABNORMAL LOW (ref 15–41)
Albumin: 3.4 g/dL — ABNORMAL LOW (ref 3.5–5.0)
Alkaline Phosphatase: 68 U/L (ref 38–126)
Anion gap: 7 (ref 5–15)
BUN: 8 mg/dL (ref 6–20)
CO2: 27 mmol/L (ref 22–32)
Calcium: 8.4 mg/dL — ABNORMAL LOW (ref 8.9–10.3)
Chloride: 102 mmol/L (ref 98–111)
Creatinine, Ser: 0.65 mg/dL (ref 0.44–1.00)
GFR, Estimated: >60 mL/min (ref >60)
Glucose, Bld: 171 mg/dL — ABNORMAL HIGH (ref 70–99)
Potassium: 3.4 mmol/L — ABNORMAL LOW (ref 3.5–5.1)
Sodium: 136 mmol/L (ref 135–145)
Total Bilirubin: 0.6 mg/dL (ref 0.3–1.2)
Total Protein: 6.8 g/dL (ref 6.5–8.1)

## 2023-03-28 LAB — MAGNESIUM: Magnesium: 2.1 mg/dL (ref 1.7–2.4)

## 2023-03-28 LAB — GLUCOSE, CAPILLARY
Glucose-Capillary: 101 mg/dL — ABNORMAL HIGH (ref 70–99)
Glucose-Capillary: 38 mg/dL — CL (ref 70–99)
Glucose-Capillary: 54 mg/dL — ABNORMAL LOW (ref 70–99)
Glucose-Capillary: 59 mg/dL — ABNORMAL LOW (ref 70–99)
Glucose-Capillary: 77 mg/dL (ref 70–99)
Glucose-Capillary: 95 mg/dL (ref 70–99)
Glucose-Capillary: 98 mg/dL (ref 70–99)

## 2023-03-28 LAB — PHOSPHORUS: Phosphorus: 3 mg/dL (ref 2.5–4.6)

## 2023-03-28 LAB — GLUCOSE, RANDOM: Glucose, Bld: 90 mg/dL (ref 70–99)

## 2023-03-28 MED ORDER — SODIUM CHLORIDE 0.9 % IV BOLUS
250.0000 mL | Freq: Once | INTRAVENOUS | Status: AC
  Administered 2023-03-28: 250 mL via INTRAVENOUS

## 2023-03-28 NOTE — Plan of Care (Signed)
  Problem: Education: Goal: Knowledge of General Education information will improve Description: Including pain rating scale, medication(s)/side effects and non-pharmacologic comfort measures 03/28/2023 0419 by Elder Cyphers, RN Outcome: Progressing 03/28/2023 0417 by Elder Cyphers, RN Outcome: Progressing   Problem: Health Behavior/Discharge Planning: Goal: Ability to manage health-related needs will improve 03/28/2023 0419 by Elder Cyphers, RN Outcome: Progressing 03/28/2023 0417 by Elder Cyphers, RN Outcome: Progressing   Problem: Clinical Measurements: Goal: Ability to maintain clinical measurements within normal limits will improve 03/28/2023 0419 by Elder Cyphers, RN Outcome: Progressing 03/28/2023 0417 by Elder Cyphers, RN Outcome: Progressing Goal: Will remain free from infection 03/28/2023 0419 by Elder Cyphers, RN Outcome: Progressing 03/28/2023 0417 by Elder Cyphers, RN Outcome: Progressing Goal: Diagnostic test results will improve 03/28/2023 0419 by Elder Cyphers, RN Outcome: Progressing 03/28/2023 0417 by Elder Cyphers, RN Outcome: Progressing Goal: Respiratory complications will improve 03/28/2023 0419 by Elder Cyphers, RN Outcome: Progressing 03/28/2023 0417 by Elder Cyphers, RN Outcome: Progressing Goal: Cardiovascular complication will be avoided 03/28/2023 0419 by Elder Cyphers, RN Outcome: Progressing 03/28/2023 0417 by Elder Cyphers, RN Outcome: Progressing   Problem: Activity: Goal: Risk for activity intolerance will decrease 03/28/2023 0419 by Elder Cyphers, RN Outcome: Progressing 03/28/2023 0417 by Elder Cyphers, RN Outcome: Progressing   Problem: Nutrition: Goal: Adequate nutrition will be maintained 03/28/2023 0419 by Elder Cyphers, RN Outcome: Progressing 03/28/2023 0417 by Elder Cyphers, RN Outcome: Progressing   Problem: Coping: Goal: Level of anxiety will decrease 03/28/2023 0419 by Elder Cyphers, RN Outcome: Progressing 03/28/2023 0417 by Elder Cyphers, RN Outcome: Progressing   Problem: Elimination: Goal: Will not experience complications related to bowel motility 03/28/2023 0419 by Elder Cyphers, RN Outcome: Progressing 03/28/2023 0417 by Elder Cyphers, RN Outcome: Progressing Goal: Will not experience complications related to urinary retention 03/28/2023 0419 by Elder Cyphers, RN Outcome: Progressing 03/28/2023 0417 by Elder Cyphers, RN Outcome: Progressing   Problem: Pain Managment: Goal: General experience of comfort will improve 03/28/2023 0419 by Elder Cyphers, RN Outcome: Progressing 03/28/2023 0417 by Elder Cyphers, RN Outcome: Progressing   Problem: Safety: Goal: Ability to remain free from injury will improve 03/28/2023 0419 by Elder Cyphers, RN Outcome: Progressing 03/28/2023 0417 by Elder Cyphers, RN Outcome: Progressing   Problem: Skin Integrity: Goal: Risk for impaired skin integrity will decrease 03/28/2023 0419 by Elder Cyphers, RN Outcome: Progressing 03/28/2023 0417 by Elder Cyphers, RN Outcome: Progressing

## 2023-03-28 NOTE — Progress Notes (Signed)
2050 - Nurse Aide can to this nurse stating that patient was feeling dizzy. Blood sugar was checked with a result of 59. Patient was given apple juice and sugar was later re-checked 77.

## 2023-03-28 NOTE — Plan of Care (Signed)

## 2023-03-28 NOTE — Progress Notes (Signed)
Calorie Count Note   48 hour calorie count ordered.   Diet: Gluten free Supplements: Ensure Plus High Protein po TID, each supplement provides 350 kcal and 20 grams of protein. -not drinking  From Home: -Soy milk (each provides 150 kcals and 8g protein) -Ensure Planted based protein shake (each provides 180 kcals and 20g protein).    Day 2 Breakfast 8/23: 320 kcals, 11g protein Lunch 8/22: 350 kcals, 9g protein Dinner 8/22: 75 kcals, 2g protein Snacks: nothing documented Supplements: 1 soy milk, 1/2 Ensure Planted = 240 kcals, 18g protein   Total intake: 985 kcal (70% of minimum estimated needs)  40g protein (66% of minimum estimated needs)   Patient will need to consume more protein supplements to help meet needs. Patient drinking soy protein shake (each provides 150 kcals and 8g protein) and Ensure Planted based protein shake (each provides 180 kcals and 20g protein).   Nutrition Dx: Severe Malnutrition related to chronic illness as evidenced by mild fat depletion, severe muscle depletion, percent weight loss (22% in 1 year).    Goal: Pt to meet >/= 90% of their estimated nutrition needs    Intervention:  -If unable to meet needs via PO, recommend J-tube placement. Not certain if patient can consistently meet needs only via PO. Pt having hypoglycemia events, night feeds may help with this. -Recommend small frequent meals -Multivitamin with minerals daily -Recommend folic acid supplementation given low folate levels. -Vitamin B-6 labs still pending -Continue home supplements   Tilda Franco, MS, RD, LDN Inpatient Clinical Dietitian Contact information available via Amion

## 2023-03-28 NOTE — Progress Notes (Signed)
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  She does have chronic nausea and postprandial epigastric pain, symptoms are recently worsening at least a month.  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  HIDA scan and ultrasounds were negative.  In the emergency room electrolytes are normal.  No evidence of infection.  Blood pressure remains soft. Episodes of hypoglycemia on fingerstick still on 10% dextrose.   Intolerance to multiple medications. Remains in the hospital due to poor appetite.  Low blood sugars.  Multiple modality of treatment started.  Assessment & Plan:   Postprandial abdominal pain associated with nausea and progressive weight loss, Failure to thrive, Celiac disease Severe protein calorie malnutrition as evidenced by Nutrition Status: Nutrition Problem: Severe Malnutrition Etiology: chronic illness Signs/Symptoms: mild fat depletion, severe muscle depletion, percent weight loss (22% in 1 year) Percent weight loss: 22 % (in 1 year) Interventions: Refer to RD note for recommendations, MVI, Tube feeding   Possibly from celiac disease and not likely from SMA syndrome. On gluten-free diet.  Tolerating 50-60% of the meal last 24 hours.  Significant symptoms causing extreme debility and weight loss.  GI following.  Did not tolerate NG tube insertion. Close monitoring of electrolytes, vitamin levels.  Nutrition is following. Did not tolerate Protonix and Remeron. -Continue gluten-free diet.  Food diary.  Calorie count. -Scheduled Zofran before meals.  Some clinical response today.  Seizure disorder: Stable on Lamictal.  Hypoglycemia: Recurrent due to poor intake. No glycogen storage. She had multiple abdominal imaging not suggesting of any evidence of intra-abdominal mass or insulinoma. Most of the hypoglycemia were asymptomatic. Patient is able to correct blood sugars with oral intake. Check blood sugars only for symptomatic hypoglycemia including dizziness, lightheadedness, palpitations or drowsiness.  One of the parent is at the bedside all the time. Discontinue dextrose infusion to challenge if she can keep up by mouth. When blood sugar is less than 40, collect sulfonylurea levels, C-peptide, insulin and proinsulin levels.    DVT prophylaxis: SCDs Start: 03/23/23 2105   Code Status: Full code Family Communication: Father at the bedside. Disposition Plan: Status is: Inpatient Remains inpatient appropriate because: Severely symptomatic weight loss, unable to eat.     Consultants:  Gastroenterology Psychiatry  Procedures:  None  Antimicrobials:  None   Subjective: Patient seen and examined.  Last 24 hours were fairly well.  She did eat more than 50% of 3 meals.  She rested last night.  Wants to go out to the sun with father. Dextrose boluses makes her sick and veins hurting.  Objective: Vitals:   03/28/23 0430 03/28/23 0558 03/28/23 0600 03/28/23 1303  BP: (!) 81/54 (!) 81/51 (!) 94/52 93/79  Pulse: 79 77  95  Resp: 14   16  Temp: 98.5 F (36.9 C)   97.9 F (36.6 C)  TempSrc: Oral   Oral  SpO2:  98%  100%  Weight:      Height:        Intake/Output Summary (Last 24 hours) at 03/28/2023 1401 Last data filed at 03/27/2023 2116 Gross per 24 hour  Intake 10 ml  Output --  Net 10 ml   Ceasar Mons  Weights   03/23/23 1409 03/25/23 0500 03/27/23 0713  Weight: 35.4 kg 41.4 kg 36 kg    Examination:  General: Looks comfortable.  Sitting in chair.  Snacking on nuts. Mucous membranes are moist. Chest: Bilateral  clear CVS: S1-S2 normal.  Regular rate rhythm. Abdomen: Mild tenderness along the epigastrium with no rigidity or guarding. Neuro: Alert awake and oriented.  No focal neurological deficits.  Data Reviewed: I have personally reviewed following labs and imaging studies  CBC: Recent Labs  Lab 03/23/23 1632 03/24/23 0353 03/25/23 1916  WBC 5.7 6.0 6.4  NEUTROABS 3.6  --   --   HGB 11.6* 9.4* 11.7*  HCT 37.3 30.0* 37.0  MCV 90.3 90.6 91.8  PLT 285 235 283   Basic Metabolic Panel: Recent Labs  Lab 03/23/23 1632 03/24/23 0353 03/24/23 1549 03/25/23 0817 03/25/23 1215 03/25/23 1916 03/26/23 0629 03/28/23 0333 03/28/23 0820  NA 136 137  --   --   --   --   --  136  --   K 3.9 3.8  --   --   --   --   --  3.4*  --   CL 103 106  --   --   --   --   --  102  --   CO2 27 25  --   --   --   --   --  27  --   GLUCOSE 82 79  --  191* 62*  --   --  171* 90  BUN 7 9  --   --   --   --   --  8  --   CREATININE 0.77 0.80  --   --   --   --   --  0.65  --   CALCIUM 8.9 8.4*  --   --   --   --   --  8.4*  --   MG  --   --  2.0 1.8  --  2.0 1.9 2.1  --   PHOS  --   --  4.3 2.7  --  3.7 3.9 3.0  --    GFR: Estimated Creatinine Clearance: 60 mL/min (by C-G formula based on SCr of 0.65 mg/dL). Liver Function Tests: Recent Labs  Lab 03/23/23 1632 03/28/23 0333  AST 15 14*  ALT 16 15  ALKPHOS 55 68  BILITOT 0.6 0.6  PROT 7.3 6.8  ALBUMIN 3.8 3.4*   Recent Labs  Lab 03/23/23 1632  LIPASE 36   No results for input(s): "AMMONIA" in the last 168 hours. Coagulation Profile: No results for input(s): "INR", "PROTIME" in the last 168 hours. Cardiac Enzymes: No results for input(s): "CKTOTAL", "CKMB", "CKMBINDEX", "TROPONINI" in the last 168 hours. BNP (last 3 results) No results for input(s): "PROBNP" in the last 8760 hours. HbA1C: No results for input(s): "HGBA1C" in the last 72 hours. CBG: Recent Labs  Lab 03/28/23 0008 03/28/23 0411 03/28/23 0729 03/28/23 0810  03/28/23 0900  GLUCAP 101* 95 38* 98 54*   Lipid Profile: No results for input(s): "CHOL", "HDL", "LDLCALC", "TRIG", "CHOLHDL", "LDLDIRECT" in the last 72 hours. Thyroid Function Tests: No results for input(s): "TSH", "T4TOTAL", "FREET4", "T3FREE", "THYROIDAB" in the last 72 hours. Anemia Panel: No results for input(s): "VITAMINB12", "FOLATE", "FERRITIN", "TIBC", "IRON", "RETICCTPCT" in the last 72 hours.  Sepsis Labs: No results for input(s): "PROCALCITON", "LATICACIDVEN" in the last 168 hours.  No results found for this or any previous visit (from the  past 240 hour(s)).       Radiology Studies: No results found.      Scheduled Meds:  feeding supplement  237 mL Oral TID BM   lamoTRIgine  200 mg Oral BID   multivitamin  1 tablet Oral Daily   Norgestimate-Ethinyl Estradiol Triphasic  1 tablet Oral Q supper   ondansetron  4 mg Oral TID AC   sodium chloride flush  3 mL Intravenous Q12H   Continuous Infusions:     LOS: 5 days    Time spent: 35 minutes    Dorcas Carrow, MD [facility]

## 2023-03-29 DIAGNOSIS — R1013 Epigastric pain: Secondary | ICD-10-CM | POA: Diagnosis not present

## 2023-03-29 LAB — COMPREHENSIVE METABOLIC PANEL
ALT: 15 U/L (ref 0–44)
AST: 16 U/L (ref 15–41)
Albumin: 3.3 g/dL — ABNORMAL LOW (ref 3.5–5.0)
Alkaline Phosphatase: 75 U/L (ref 38–126)
Anion gap: 7 (ref 5–15)
BUN: 12 mg/dL (ref 6–20)
CO2: 28 mmol/L (ref 22–32)
Calcium: 8.5 mg/dL — ABNORMAL LOW (ref 8.9–10.3)
Chloride: 103 mmol/L (ref 98–111)
Creatinine, Ser: 0.69 mg/dL (ref 0.44–1.00)
GFR, Estimated: >60 mL/min (ref >60)
Glucose, Bld: 82 mg/dL (ref 70–99)
Potassium: 4.2 mmol/L (ref 3.5–5.1)
Sodium: 138 mmol/L (ref 135–145)
Total Bilirubin: 0.4 mg/dL (ref 0.3–1.2)
Total Protein: 6.7 g/dL (ref 6.5–8.1)

## 2023-03-29 LAB — PHOSPHORUS: Phosphorus: 3.5 mg/dL (ref 2.5–4.6)

## 2023-03-29 LAB — GLUCOSE, CAPILLARY: Glucose-Capillary: 73 mg/dL (ref 70–99)

## 2023-03-29 LAB — MAGNESIUM: Magnesium: 2.3 mg/dL (ref 1.7–2.4)

## 2023-03-29 LAB — C-PEPTIDE: C-Peptide: 4.8 ng/mL — ABNORMAL HIGH (ref 1.1–4.4)

## 2023-03-29 MED ORDER — LANCET DEVICE MISC: 1.0000 | Freq: Three times a day (TID) | 0 refills | Status: AC

## 2023-03-29 MED ORDER — ENSURE ENLIVE PO LIQD: 237.0000 mL | Freq: Three times a day (TID) | ORAL | 12 refills | Status: DC

## 2023-03-29 MED ORDER — SENNOSIDES-DOCUSATE SODIUM 8.6-50 MG PO TABS: 1.0000 | ORAL_TABLET | Freq: Every evening | ORAL | 0 refills | Status: AC | PRN

## 2023-03-29 MED ORDER — LANCETS MISC. MISC: 1.0000 | Freq: Three times a day (TID) | 0 refills | Status: AC

## 2023-03-29 MED ORDER — PROSIGHT PO TABS: 1.0000 | ORAL_TABLET | Freq: Every day | ORAL | 0 refills | Status: AC

## 2023-03-29 MED ORDER — BLOOD GLUCOSE TEST VI STRP: 1.0000 | ORAL_STRIP | Freq: Three times a day (TID) | 0 refills | Status: AC

## 2023-03-29 MED ORDER — BLOOD PRESSURE KIT: 1.0000 | PACK | Freq: Once | 0 refills | Status: AC

## 2023-03-29 MED ORDER — BLOOD GLUCOSE MONITORING SUPPL DEVI: 1.0000 | Freq: Three times a day (TID) | 0 refills | Status: AC

## 2023-03-29 NOTE — Progress Notes (Signed)
Pt discharged home with family. Pt is alert and oriented. Pt father at bedside. AVS was given and explained to pt and pt father. IV was discontinued. Belongings were packed and sent home with the patient. Pt father to transport pt home. Pt ambulated to main entrance for discharge.

## 2023-03-29 NOTE — Progress Notes (Signed)
1610 - Patient blood sugar 73.

## 2023-03-29 NOTE — Discharge Summary (Addendum)
Physician Discharge Summary  Martha Wells RUE:[id number] DOB: Apr 03, 1996 DOA: 03/23/2023  PCP: Martha Banner, MD  Admit date: 03/23/2023 Discharge date: 03/29/2023  Admitted From: Home Discharge disposition: Home  Recommendations at discharge:  Continue current diet plan at home.  Follow-up with GI as an outpatient. Monitor glucose at home.  Prescription sent for a glucometer and supplies.   Brief narrative: Martha Wells is a 27 y.o. 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 In July 2024, she was seen by GI as an outpatient for multiyear history of epigastric pain.  She had extensive workup done including HIDA scan, EGD, CT abdomen pelvis with contrast and lab works.  Recent EGD 02/25/2023 showed biopsies consistent with celiac disease.  Serology was also consistent with celiac disease.  Patient was then started on gluten-free diet but did not have improvement in symptoms.  She had also tried PPI, H2 blocker, dicyclomine and Carafate with no improvement.  She continued to have postprandial pain and hence significantly reduced oral intake.  Suffered significant weight loss resulting in a BMI of 14.  On admission, she underwent CT angio abdomen pelvis which did not show evidence of mesenteric ischemia.  It showed decreased superior mesenteric artery distance measuring 4.6 mm which is seen in the setting of SMA syndrome.  It also showed large amount of stool throughout the colon.  Admitted to [facility] GI following Denies of poor oral intake, she had hypotension, hypoglycemia and is requiring 10% dextrose.  Subjective: Patient was seen and examined this morning.  Pleasant young Caucasian female.  Sitting up in chair.  Not in distress.  Father at bedside.  We had a long conversation  about patient's ongoing struggle with appetite, epigastric pain, weight loss. In the hospital, patient has been able to tolerate some diet.  Patient and family want to continue the same plan at home.  At this time, there is no other indication of further inpatient monitoring. Chart reviewed In the last 24 hours, remains hypotensive in 90s. 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 Patient was started on Remeron which she could not tolerate.  With supportive care, currently tolerating 50 to 60% of the meal in the last 48 hours Did not tolerate NG tube insertion. Continue oral intake, calorie count Gluten-free diet Zofran Premeal Electrolytes level normal as below Recent Labs  Lab 03/23/23 1632 03/24/23 0353 03/24/23 1549 03/25/23 0817 03/25/23 1916 03/26/23 0629 03/28/23 0333 03/29/23 0322  K 3.9 3.8  --   --   --   --  3.4* 4.2  MG  --   --    < > 1.8 2.0 1.9 2.1 2.3  PHOS  --   --    < > 2.7 3.7 3.9 3.0 3.5   < > = values in this interval not displayed.   HypOtension HypOglycemia Low blood pressure and low glucose level due to poor intake. Initially treated with dextrose drip.  Currently off. Because of recurrent risk of possible prescription sent for glucometer and supplies. Recent Labs  Lab 03/28/23 0810 03/28/23 0900 03/28/23 2052 03/28/23 2146 03/29/23 0017  GLUCAP 98 54* 59* 77 73   Seizure disorder Stable on Lamictal.  Constipation CT  abdomen on admission showed moderate stool burden in the colon.  Probably due to dehydration.  Constipation could be adding to her abdominal symptoms as well.  Recommend regular bowel regimen with senna docusate PRN     Mobility: Encourage ambulation  Goals of care   Code Status: Full Code   Wounds:  -    Discharge Exam:   Vitals:   03/28/23  1303 03/28/23 2046 03/29/23 0554 03/29/23 0710  BP: 93/79 100/74 (!) 98/54   Pulse: 95 85 77   Resp: 16 18 16    Temp: 97.9 F (36.6 C) (!) 97.4 F (36.3 C) 98.3 F (36.8 C)   TempSrc: Oral     SpO2: 100% 93% 98%   Weight:    36 kg  Height:        Body mass index is 14.5 kg/m.  General exam: Young thin built Caucasian female.  Not in physical distress at this time Skin: No rashes, lesions or ulcers. HEENT: Atraumatic, normocephalic, no obvious bleeding Lungs: Clear to auscultation bilaterally CVS: Regular rate and rhythm, no murmur GI/Abd soft, tenderness in epigastrium, nondistended, bowel sound present CNS: Alert, awake, oriented x 3 Psychiatry: Mood appropriate Extremities: No pedal edema, no calf tenderness  Follow ups:    Follow-up Information     Martha Banner, MD Follow up.   Specialty: Family Medicine Contact information: [address] [place] [address] [phone]                 Discharge Instructions:   Discharge Instructions     Call MD for:  difficulty breathing, headache or visual disturbances   Complete by: As directed    Call MD for:  extreme fatigue   Complete by: As directed    Call MD for:  hives   Complete by: As directed    Call MD for:  persistant dizziness or light-headedness   Complete by: As directed    Call MD for:  persistant nausea and vomiting   Complete by: As directed    Call MD for:  severe uncontrolled pain   Complete by: As directed    Call MD for:  temperature >100.4   Complete by: As directed    Diet general   Complete by: As directed    Discharge instructions   Complete by: As directed    Recommendations at discharge:   Continue current diet plan at home.  Follow-up with GI as an outpatient.  General discharge instructions: Follow with Primary MD Martha Banner, MD in 7 days  Please request your PCP  to go over your hospital tests, procedures, radiology results at the follow up. Please get your  medicines reviewed and adjusted.  Your PCP may decide to repeat certain labs or tests as needed. Do not drive, operate heavy machinery, perform activities at heights, swimming or participation in water activities or provide baby sitting services if your were admitted for syncope or siezures until you have seen by Primary MD or a Neurologist and advised to do so again. [place] Controlled Substance Reporting System database was reviewed. Do not drive, operate heavy machinery, perform activities at heights, swim, participate in water activities or provide baby-sitting services while on medications for pain, sleep and mood until your outpatient physician has reevaluated you and advised to do so again.  You are strongly recommended to comply with the dose, frequency and duration of prescribed medications. Activity: As tolerated with Full fall precautions use walker/cane & assistance as  needed Avoid using any recreational substances like cigarette, tobacco, alcohol, or non-prescribed drug. If you experience worsening of your admission symptoms, develop shortness of breath, life threatening emergency, suicidal or homicidal thoughts you must seek medical attention immediately by calling 911 or calling your MD immediately  if symptoms less severe. You must read complete instructions/literature along with all the possible adverse reactions/side effects for all the medicines you take and that have been prescribed to you. Take any new medicine only after you have completely understood and accepted all the possible adverse reactions/side effects.  Wear Seat belts while driving. You were cared for by a hospitalist during your hospital stay. If you have any questions about your discharge medications or the care you received while you were in the hospital after you are discharged, you can call the unit and ask to speak with the hospitalist or the covering physician. Once you are discharged, your primary care  physician will handle any further medical issues. Please note that NO REFILLS for any discharge medications will be authorized once you are discharged, as it is imperative that you return to your primary care physician (or establish a relationship with a primary care physician if you do not have one).   Increase activity slowly   Complete by: As directed        Discharge Medications:   Allergies as of 03/29/2023   No Known Allergies      Medication List     TAKE these medications    Blood Glucose Monitoring Suppl Devi 1 each by Does not apply route in the morning, at noon, and at bedtime. May substitute to any manufacturer covered by patient's insurance.   BLOOD GLUCOSE TEST STRIPS Strp 1 each by In Vitro route in the morning, at noon, and at bedtime. May substitute to any manufacturer covered by patient's insurance.   Blood Pressure Kit 1 each by Does not apply route once for 1 dose.   CHEWABLE IRON PO Take 1 tablet by mouth daily.   feeding supplement Liqd Take 237 mLs by mouth 3 (three) times daily between meals.   LamoTRIgine 200 MG Tb24 24 hour tablet Take 1 tablet (200 mg total) by mouth 2 (two) times daily.   Lancet Device Misc 1 each by Does not apply route in the morning, at noon, and at bedtime. May substitute to any manufacturer covered by patient's insurance.   Lancets Misc. Misc 1 each by Does not apply route in the morning, at noon, and at bedtime. May substitute to any manufacturer covered by patient's insurance.   multivitamin Tabs tablet Take 1 tablet by mouth daily. Start taking on: March 30, 2023   ondansetron 4 MG disintegrating tablet Commonly known as: ZOFRAN-ODT Take 4 mg by mouth 3 (three) times daily as needed.   Rolling Walker/Burgundy Misc 1 rolling walker for home use   senna-docusate 8.6-50 MG tablet Commonly known as: Senokot-S Take 1 tablet by mouth at bedtime as needed for mild constipation.   sucralfate 1 GM/10ML  suspension Commonly known as: CARAFATE Take 10 mLs (1 g total) by mouth 2 (two) times daily.   TRI-SPRINTEC PO Take 1 tablet by mouth daily with supper.   VITAMIN B COMPLEX PO Take 1 tablet by mouth as needed. gummy         The results of significant diagnostics from this hospitalization (including imaging, microbiology, ancillary and laboratory) are listed below for reference.    Procedures and Diagnostic Studies:   CT Angio Abd/Pel W  and/or Wo Contrast  Result Date: 03/23/2023 CLINICAL DATA:  Evaluate for mesenteric ischemia. Abdominal pain and nausea. EXAM: CTA ABDOMEN AND PELVIS WITHOUT AND WITH CONTRAST TECHNIQUE: Multidetector CT imaging of the abdomen and pelvis was performed using the standard protocol during bolus administration of intravenous contrast. Multiplanar reconstructed images and MIPs were obtained and reviewed to evaluate the vascular anatomy. RADIATION DOSE REDUCTION: This exam was performed according to the departmental dose-optimization program which includes automated exposure control, adjustment of the mA and/or kV according to patient size and/or use of iterative reconstruction technique. CONTRAST:  OMNIPAQUE IOHEXOL 350 MG/ML SOLN COMPARISON:  CT abdomen and pelvis 01/15/2022 FINDINGS: VASCULAR Aorta: Normal caliber aorta without aneurysm, dissection, vasculitis or significant stenosis. Celiac: Patent without evidence of aneurysm, dissection, vasculitis or significant stenosis. SMA: Patent without evidence of aneurysm, dissection, vasculitis or significant stenosis. Superior mesenteric artery distance is decreased measuring 4.6 mm. Angle is not well measured secondary to positioning of the artery. Renals: Both renal arteries are patent without evidence of aneurysm, dissection, vasculitis, fibromuscular dysplasia or significant stenosis. IMA: Patent without evidence of aneurysm, dissection, vasculitis or significant stenosis. Inflow: Patent without evidence of  aneurysm, dissection, vasculitis or significant stenosis. Proximal Outflow: Bilateral common femoral and visualized portions of the superficial and profunda femoral arteries are patent without evidence of aneurysm, dissection, vasculitis or significant stenosis. Veins: No acute abnormality. Review of the MIP images confirms the above findings. NON-VASCULAR Lower chest: No acute abnormality. Hepatobiliary: No focal liver abnormality is seen. No gallstones, gallbladder wall thickening, or biliary dilatation. Pancreas: Unremarkable. No pancreatic ductal dilatation or surrounding inflammatory changes. Spleen: Normal in size without focal abnormality. Adrenals/Urinary Tract: Adrenal glands are unremarkable. Kidneys are normal, without renal calculi, focal lesion, or hydronephrosis. Bladder is unremarkable. Stomach/Bowel: Stomach is within normal limits. Appendix is not seen. No evidence of bowel wall thickening, distention, or inflammatory changes. There is a large amount of stool throughout the colon. Lymphatic: No enlarged lymph nodes. Reproductive: Uterus and bilateral adnexa are unremarkable. Other: No abdominal wall hernia or abnormality. No abdominopelvic ascites. Musculoskeletal: No acute or significant osseous findings. IMPRESSION: 1. No evidence for mesenteric ischemia. 2. Decreased superior mesenteric artery distance measuring 4.6 mm. 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 NON-VASCULAR 1. No acute localizing process in the abdomen or pelvis. 2. Large amount of stool throughout the colon. Electronically Signed   By: Darliss Cheney M.D.   On: 03/23/2023 19:11     Labs:   Basic Metabolic Panel: Recent Labs  Lab 03/23/23 1632 03/24/23 0353 03/24/23 1549 03/25/23 0817 03/25/23 1215 03/25/23 1916 03/26/23 0629 03/28/23 0333 03/28/23 0820 03/29/23 0322  NA 136 137  --   --   --   --   --  136  --  138  K 3.9 3.8  --   --   --   --   --  3.4*  --  4.2  CL 103 106  --    --   --   --   --  102  --  103  CO2 27 25  --   --   --   --   --  27  --  28  GLUCOSE 82 79  --  191* 62*  --   --  171* 90 82  BUN 7 9  --   --   --   --   --  8  --  12  CREATININE 0.77 0.80  --   --   --   --   --  0.65  --  0.69  CALCIUM 8.9 8.4*  --   --   --   --   --  8.4*  --  8.5*  MG  --   --    < > 1.8  --  2.0 1.9 2.1  --  2.3  PHOS  --   --    < > 2.7  --  3.7 3.9 3.0  --  3.5   < > = values in this interval not displayed.   GFR Estimated Creatinine Clearance: 60 mL/min (by C-G formula based on SCr of 0.69 mg/dL). Liver Function Tests: Recent Labs  Lab 03/23/23 1632 03/28/23 0333 03/29/23 0322  AST 15 14* 16  ALT 16 15 15   ALKPHOS 55 68 75  BILITOT 0.6 0.6 0.4  PROT 7.3 6.8 6.7  ALBUMIN 3.8 3.4* 3.3*   Recent Labs  Lab 03/23/23 1632  LIPASE 36   No results for input(s): "AMMONIA" in the last 168 hours. Coagulation profile No results for input(s): "INR", "PROTIME" in the last 168 hours.  CBC: Recent Labs  Lab 03/23/23 1632 03/24/23 0353 03/25/23 1916  WBC 5.7 6.0 6.4  NEUTROABS 3.6  --   --   HGB 11.6* 9.4* 11.7*  HCT 37.3 30.0* 37.0  MCV 90.3 90.6 91.8  PLT 285 235 283   Cardiac Enzymes: No results for input(s): "CKTOTAL", "CKMB", "CKMBINDEX", "TROPONINI" in the last 168 hours. BNP: Invalid input(s): "POCBNP" CBG: Recent Labs  Lab 03/28/23 0810 03/28/23 0900 03/28/23 2052 03/28/23 2146 03/29/23 0017  GLUCAP 98 54* 59* 77 73   D-Dimer No results for input(s): "DDIMER" in the last 72 hours. Hgb A1c No results for input(s): "HGBA1C" in the last 72 hours. Lipid Profile No results for input(s): "CHOL", "HDL", "LDLCALC", "TRIG", "CHOLHDL", "LDLDIRECT" in the last 72 hours. Thyroid function studies No results for input(s): "TSH", "T4TOTAL", "T3FREE", "THYROIDAB" in the last 72 hours.  Invalid input(s): "FREET3" Anemia work up No results for input(s): "VITAMINB12", "FOLATE", "FERRITIN", "TIBC", "IRON", "RETICCTPCT" in the last 72  hours. Microbiology No results found for this or any previous visit (from the past 240 hour(s)).  Time coordinating discharge: 45 minutes  Signed: Eisha Chatterjee  [facility] 03/29/2023, 12:18 PM

## 2023-03-29 NOTE — Plan of Care (Signed)

## 2023-04-01 ENCOUNTER — Other Ambulatory Visit: Payer: Self-pay | Admitting: Nurse Practitioner

## 2023-04-01 DIAGNOSIS — D509 Iron deficiency anemia, unspecified: Secondary | ICD-10-CM

## 2023-04-01 LAB — SULFONYLUREA HYPOGLYCEMICS PANEL, SERUM

## 2023-04-01 LAB — PROINSULIN/INSULIN RATIO

## 2023-04-01 LAB — VITAMIN B6: Vitamin B6: 5.6 ug/L (ref 3.4–65.2)

## 2023-04-01 NOTE — Progress Notes (Unsigned)
New Hematology/Oncology Consult   Requesting MD: Dr. Yancey Flemings  [phone]      Reason for Consult: Iron deficiency anemia  HPI: Martha Wells is a 27 year old woman referred for iron deficiency.  Labs completed 01/07/2023 with hemoglobin 11, MCV 82, white count 5.0, platelet count 327,000, ferritin 4, B12 211, unremarkable chemistry panel.  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  Tissue transglutaminase IgA elevated.  Per GI office note 02/25/2023 iron infusions were discussed and patient declined.  She is not tolerant to oral iron therapy.  She was hospitalized 03/23/2023 with abdominal pain.  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  Most recent CBC 03/25/2023 with hemoglobin 11.7, MCV 91; iron, TIBC, saturation all normal on 03/25/2023.  Only bleeding is monthly menstrual cycle which at times is heavy.  She is taking a multivitamin, iron supplement in pill and gummy form, feels she is tolerating well.  She does not crave ice.  No tongue soreness.  No lip cracks, nails are thin and occasionally break.  She continues to have abdominal pain intermittently after eating.  She is able to tolerate some foods.  She has fairly consistent nausea, notes that Zofran helps.  Her father thinks she may be lactose intolerant.  Her energy level is better since she was discharged from the hospital.  No shortness of breath.  She states she is "trying hard to eat".   Past Medical History:  Diagnosis Date   Avoidant or restrictive food intake disorder    Hx of foot surgery 2008   reconstruction- right    Seizures (HCC)    57 weeks old      Past Surgical History:  Procedure Laterality Date   FOOT SURGERY Right 2008   reconstruction   TYMPANOSTOMY TUBE PLACEMENT       Current Outpatient Medications:    Blood Glucose Monitoring Suppl DEVI, 1 each by Does not apply route in the morning, at noon, and at bedtime. May substitute to any manufacturer covered by patient's insurance., Disp: 1 each, Rfl: 0   feeding supplement (ENSURE ENLIVE / ENSURE PLUS) LIQD, Take 237 mLs by mouth 3 (three) times daily between meals., Disp: 237 mL, Rfl: 12   Glucose Blood (BLOOD GLUCOSE TEST STRIPS) STRP, 1 each by In Vitro route in the morning, at noon, and at bedtime. May substitute to any manufacturer covered by patient's insurance., Disp: 100 strip, Rfl: 0   Iron Carbonyl-Vitamin C-FOS (CHEWABLE IRON PO), Take 1 tablet by mouth daily., Disp: , Rfl:    LamoTRIgine 200 MG TB24 24 hour tablet, Take 1 tablet (200 mg total) by mouth 2 (two) times daily., Disp: 180 tablet, Rfl: 3   Lancet Device MISC, 1 each by Does not apply route in the morning, at noon, and at bedtime. May substitute to any manufacturer covered by patient's insurance., Disp: 1 each, Rfl: 0   Lancets Misc. MISC, 1 each by Does not apply route in the morning, at noon, and at bedtime. May substitute to any manufacturer covered by patient's insurance., Disp: 100 each, Rfl: 0   Misc. Devices (ROLLING WALKER/BURGUNDY) MISC, 1 rolling walker for home use, Disp: 1 each, Rfl: 0   multivitamin (PROSIGHT) TABS tablet, Take 1 tablet by mouth daily., Disp: 30  tablet, Rfl: 0   Norgestim-Eth Estrad Triphasic (TRI-SPRINTEC PO), Take 1 tablet by mouth daily with supper., Disp: , Rfl:    ondansetron (ZOFRAN-ODT) 4 MG disintegrating tablet, Take 4 mg by mouth 3 (three) times daily as needed., Disp: , Rfl:    senna-docusate (SENOKOT-S) 8.6-50 MG tablet, Take 1 tablet by mouth at bedtime as needed for mild constipation., Disp: 30 tablet, Rfl: 0   sucralfate (CARAFATE) 1 GM/10ML suspension, Take 10 mLs  (1 g total) by mouth 2 (two) times daily. (Patient not taking: Reported on 02/25/2023), Disp: 420 mL, Rfl: 1:    No Known Allergies:  FH: Mother with history of anemia.  SOCIAL HISTORY: She lives in [place] with her dad and 2 brothers.  Her mother lives close by.  No tobacco or alcohol use.  Review of Systems: No fevers or sweats.  No unusual headaches.  No vision change.  Bowels typically move every few days but she may go as long as 5 or 6 days without a bowel movement.  This is a normal pattern for her.  No hematuria.  No numbness or tingling in the hands or feet.  She intermittently notes numbness on the left side of her body.  In the past the numbness has proceeded a seizure.  She has not had a seizure since she was 27 years old.  Physical Exam:  Blood pressure 101/73, pulse 70, temperature 98.2 F (36.8 C), temperature source Oral, resp. rate 18, height 5\' 2"  (1.575 m), weight 83 lb 1.6 oz (37.7 kg), SpO2 92%.  HEENT: No thrush or ulcers. Lungs: Lungs clear bilaterally. Cardiac: Regular rate and rhythm. Abdomen: No hepatosplenomegaly. Vascular: No leg edema. Lymph nodes: Shotty bilateral inguinal nodes. Skin: No rash.  LABS:   Recent Labs    04/02/23 1056  WBC 5.5  HGB 10.9*  HCT 34.1*  PLT 406*  Peripheral blood smear-there are microcytes, some red cells are hypochromic, mild increase in polychromasia, rare teardrop; platelets appear increased in number; white cell morphology unremarkable.  No results for input(s): "NA", "K", "CL", "CO2", "GLUCOSE", "BUN", "CREATININE", "CALCIUM" in the last 72 hours.    RADIOLOGY:  CT Angio Abd/Pel W and/or Wo Contrast  Result Date: 03/23/2023 CLINICAL DATA:  Evaluate for mesenteric ischemia. Abdominal pain and nausea. EXAM: CTA ABDOMEN AND PELVIS WITHOUT AND WITH CONTRAST TECHNIQUE: Multidetector CT imaging of the abdomen and pelvis was performed using the standard protocol during bolus administration of intravenous contrast.  Multiplanar reconstructed images and MIPs were obtained and reviewed to evaluate the vascular anatomy. RADIATION DOSE REDUCTION: This exam was performed according to the departmental dose-optimization program which includes automated exposure control, adjustment of the mA and/or kV according to patient size and/or use of iterative reconstruction technique. CONTRAST:  OMNIPAQUE IOHEXOL 350 MG/ML SOLN COMPARISON:  CT abdomen and pelvis 01/15/2022 FINDINGS: VASCULAR Aorta: Normal caliber aorta without aneurysm, dissection, vasculitis or significant stenosis. Celiac: Patent without evidence of aneurysm, dissection, vasculitis or significant stenosis. SMA: Patent without evidence of aneurysm, dissection, vasculitis or significant stenosis. Superior mesenteric artery distance is decreased measuring 4.6 mm. Angle is not well measured secondary to positioning of the artery. Renals: Both renal arteries are patent without evidence of aneurysm, dissection, vasculitis, fibromuscular dysplasia or significant stenosis. IMA: Patent without evidence of aneurysm, dissection, vasculitis or significant stenosis. Inflow: Patent without evidence of aneurysm, dissection, vasculitis or significant stenosis. Proximal Outflow: Bilateral common femoral and visualized portions of the superficial and profunda femoral arteries are patent without evidence of aneurysm,  dissection, vasculitis or significant stenosis. Veins: No acute abnormality. Review of the MIP images confirms the above findings. NON-VASCULAR Lower chest: No acute abnormality. Hepatobiliary: No focal liver abnormality is seen. No gallstones, gallbladder wall thickening, or biliary dilatation. Pancreas: Unremarkable. No pancreatic ductal dilatation or surrounding inflammatory changes. Spleen: Normal in size without focal abnormality. Adrenals/Urinary Tract: Adrenal glands are unremarkable. Kidneys are normal, without renal calculi, focal lesion, or hydronephrosis. Bladder  is unremarkable. Stomach/Bowel: Stomach is within normal limits. Appendix is not seen. No evidence of bowel wall thickening, distention, or inflammatory changes. There is a large amount of stool throughout the colon. Lymphatic: No enlarged lymph nodes. Reproductive: Uterus and bilateral adnexa are unremarkable. Other: No abdominal wall hernia or abnormality. No abdominopelvic ascites. Musculoskeletal: No acute or significant osseous findings. IMPRESSION: 1. No evidence for mesenteric ischemia. 2. Decreased superior mesenteric artery distance measuring 4.6 mm. 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 NON-VASCULAR 1. No acute localizing process in the abdomen or pelvis. 2. Large amount of stool throughout the colon. Electronically Signed   By: Darliss Cheney M.D.   On: 03/23/2023 19:11   US Abdomen Complete  Result Date: 03/16/2023 CLINICAL DATA:  Upper abdominal pain EXAM: ABDOMEN ULTRASOUND COMPLETE COMPARISON:  CT abdomen pelvis 01/15/2022 FINDINGS: Gallbladder: No gallstones or wall thickening visualized. No sonographic Murphy sign noted by sonographer. Common bile duct: Diameter: 2.6 mm Liver: No focal lesion identified. Within normal limits in parenchymal echogenicity. Portal vein is patent on color Doppler imaging with normal direction of blood flow towards the liver. IVC: No abnormality visualized. Pancreas: Visualized portion unremarkable. Spleen: Size and appearance within normal limits. Right Kidney: Length: 7.2 cm. Echogenicity within normal limits. No mass or hydronephrosis visualized. Left Kidney: Length: 8.8 cm. Echogenicity within normal limits. No mass or hydronephrosis visualized. Abdominal aorta: No aneurysm visualized. Other findings: None. IMPRESSION: No cholelithiasis or sonographic evidence for acute cholecystitis. Electronically Signed   By: Annia Belt M.D.   On: 03/16/2023 18:18   NM Hepato W/EjeCT Fract  Result Date: 03/03/2023 CLINICAL DATA:  Epigastric  abdominal pain EXAM: NUCLEAR MEDICINE HEPATOBILIARY IMAGING TECHNIQUE: Sequential images of the abdomen were obtained out to 60 minutes following intravenous administration of radiopharmaceutical. 2 mg of morphine was administered. RADIOPHARMACEUTICALS:  5.21 mCi Tc-35m  Choletec IV COMPARISON:  CT January 15, 2022. FINDINGS: Prompt uptake and biliary excretion of activity by the liver is seen. Oval focus of radiotracer activity is seen adjacent to the cystic duct which increases in filling throughout delayed scintigraphic imaging and in comparison of frontal and lateral scintigraphic imaging is similar in location to the gallbladder on CT. Biliary activity passes into small bowel, consistent with patent common bile duct. IMPRESSION: Oval focus of radiotracer activity is seen adjacent to the cystic duct which increases in filling throughout delayed scintigraphic imaging and in comparison of frontal and lateral scintigraphic imaging is similar in location to the gallbladder on CT, most compatible with radiotracer activity in the gallbladder and patent cystic duct. Electronically Signed   By: Maudry Mayhew M.D.   On: 03/03/2023 17:06    Assessment and Plan:   Normocytic anemia Iron deficiency Malnutrition Weight loss Celiac Postprandial abdominal pain, nausea  Martha Wells was referred for iron deficiency anemia.  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  Malnutrition likely contributing to the anemia as well.    She is tolerating an oral iron supplement at present.  Hemoglobin is stable and ferritin is now in the low normal range.  She will continue iron as she is currently taking  She continues follow-up with GI regarding celiac disease, postprandial abdominal pain and nausea.  She has an outpatient appointment with nutrition in a few months.  We will try to facilitate a sooner appointment.  She will return for lab and follow-up in  2 months.  Patient seen with Dr. Truett Perna.   Lonna Cobb, NP 04/02/2023, 12:15 PM  This was a shared visit with Lonna Cobb.  Ms. Mossholder was interviewed and examined.  I reviewed the peripheral blood smear.  She is referred for evaluation of iron deficiency.  She has mild normocytic anemia. The anemia occurs in the setting of chronic abdominal pain and anorexia.  The anemia is likely secondary to iron deficiency, malnutrition, menstrual blood loss, and chronic disease.  Iron deficiency is secondary to menstrual blood loss and malabsorption.  She is now tolerating oral iron and the ferritin level is in the low normal range.  Her abdominal symptoms and appetite have improved since the recent hospital admission.  She will continue oral iron replacement and return for a CBC and ferritin level in 2 months.  We will consider IV iron therapy if the iron deficiency does not resolve.  We will defer continued evaluation management of the abdominal pain, nausea, and anorexia to the GI service.  I was present for greater than 50% of today's visit.  I performed medical decision making.  Mancel Bale, MD

## 2023-04-02 ENCOUNTER — Inpatient Hospital Stay: Payer: MEDICAID

## 2023-04-02 ENCOUNTER — Encounter: Payer: Self-pay | Admitting: Nurse Practitioner

## 2023-04-02 ENCOUNTER — Inpatient Hospital Stay: Payer: MEDICAID | Attending: Nurse Practitioner | Admitting: Nurse Practitioner

## 2023-04-02 VITALS — BP 101/73 | HR 70 | Temp 98.2°F | Resp 18 | Ht 62.0 in | Wt 83.1 lb

## 2023-04-02 DIAGNOSIS — R109 Unspecified abdominal pain: Secondary | ICD-10-CM | POA: Diagnosis not present

## 2023-04-02 DIAGNOSIS — E611 Iron deficiency: Secondary | ICD-10-CM

## 2023-04-02 DIAGNOSIS — E46 Unspecified protein-calorie malnutrition: Secondary | ICD-10-CM | POA: Diagnosis not present

## 2023-04-02 DIAGNOSIS — R110 Nausea: Secondary | ICD-10-CM | POA: Insufficient documentation

## 2023-04-02 DIAGNOSIS — D509 Iron deficiency anemia, unspecified: Secondary | ICD-10-CM | POA: Insufficient documentation

## 2023-04-02 DIAGNOSIS — D649 Anemia, unspecified: Secondary | ICD-10-CM

## 2023-04-02 DIAGNOSIS — K900 Celiac disease: Secondary | ICD-10-CM

## 2023-04-02 DIAGNOSIS — R634 Abnormal weight loss: Secondary | ICD-10-CM

## 2023-04-02 LAB — CBC WITH DIFFERENTIAL (CANCER CENTER ONLY)
Abs Immature Granulocytes: 0.01 10*3/uL (ref 0.00–0.07)
Basophils Absolute: 0 10*3/uL (ref 0.0–0.1)
Basophils Relative: 1 %
Eosinophils Absolute: 0.1 10*3/uL (ref 0.0–0.5)
Eosinophils Relative: 2 %
HCT: 34.1 % — ABNORMAL LOW (ref 36.0–46.0)
Hemoglobin: 10.9 g/dL — ABNORMAL LOW (ref 12.0–15.0)
Immature Granulocytes: 0 %
Lymphocytes Relative: 26 %
Lymphs Abs: 1.4 10*3/uL (ref 0.7–4.0)
MCH: 29.1 pg (ref 26.0–34.0)
MCHC: 32 g/dL (ref 30.0–36.0)
MCV: 90.9 fL (ref 80.0–100.0)
Monocytes Absolute: 0.4 10*3/uL (ref 0.1–1.0)
Monocytes Relative: 8 %
Neutro Abs: 3.5 10*3/uL (ref 1.7–7.7)
Neutrophils Relative %: 63 %
Platelet Count: 406 10*3/uL — ABNORMAL HIGH (ref 150–400)
RBC: 3.75 MIL/uL — ABNORMAL LOW (ref 3.87–5.11)
RDW: 16.6 % — ABNORMAL HIGH (ref 11.5–15.5)
WBC Count: 5.5 10*3/uL (ref 4.0–10.5)
nRBC: 0 % (ref 0.0–0.2)

## 2023-04-02 LAB — SAVE SMEAR(SSMR), FOR PROVIDER SLIDE REVIEW

## 2023-04-02 LAB — FERRITIN: Ferritin: 17 ng/mL (ref 11–307)

## 2023-04-02 LAB — RETIC PANEL
Immature Retic Fract: 10 % (ref 2.3–15.9)
RBC.: 3.82 MIL/uL — ABNORMAL LOW (ref 3.87–5.11)
Retic Count, Absolute: 61.1 10*3/uL (ref 19.0–186.0)
Retic Ct Pct: 1.6 % (ref 0.4–3.1)
Reticulocyte Hemoglobin: 29.8 pg (ref >27.9)

## 2023-04-02 LAB — DIRECT ANTIGLOBULIN TEST (NOT AT ARMC)
DAT, IgG: NEGATIVE
DAT, complement: NEGATIVE

## 2023-04-03 NOTE — Progress Notes (Signed)
Thanks

## 2023-04-09 ENCOUNTER — Ambulatory Visit: Payer: MEDICAID | Admitting: Neurology

## 2023-04-09 ENCOUNTER — Encounter: Payer: Self-pay | Admitting: Neurology

## 2023-04-09 VITALS — BP 81/56 | HR 81 | Ht 62.0 in | Wt 84.0 lb

## 2023-04-09 DIAGNOSIS — T148XXA Other injury of unspecified body region, initial encounter: Secondary | ICD-10-CM | POA: Diagnosis not present

## 2023-04-09 DIAGNOSIS — E559 Vitamin D deficiency, unspecified: Secondary | ICD-10-CM

## 2023-04-09 DIAGNOSIS — Z79899 Other long term (current) drug therapy: Secondary | ICD-10-CM | POA: Diagnosis not present

## 2023-04-09 DIAGNOSIS — E569 Vitamin deficiency, unspecified: Secondary | ICD-10-CM

## 2023-04-09 DIAGNOSIS — M8580 Other specified disorders of bone density and structure, unspecified site: Secondary | ICD-10-CM

## 2023-04-09 DIAGNOSIS — E538 Deficiency of other specified B group vitamins: Secondary | ICD-10-CM | POA: Diagnosis not present

## 2023-04-09 DIAGNOSIS — T4275XA Adverse effect of unspecified antiepileptic and sedative-hypnotic drugs, initial encounter: Secondary | ICD-10-CM

## 2023-04-09 MED ORDER — LAMOTRIGINE ER 200 MG PO TB24: 1.0000 | ORAL_TABLET | Freq: Two times a day (BID) | ORAL | 3 refills | Status: DC

## 2023-04-09 NOTE — Patient Instructions (Signed)
Check blood work Continue Lamictal DEXA Scan

## 2023-04-09 NOTE — Progress Notes (Signed)
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  Doing very well for many years, but she was admitted this year in August for abdominal pain, she was followed by [facility] for this and recently had an endoscopy with biopsies that diagnosed her with celiac's disease.  And March 23, 2023 she had gone from 105 pound to 75 pounds in the last several few months prior.  Painful to eat.  Severe burning sensation after food.  Failed and tried numerous reflux medications.  No vomiting.  Very weak.  Dehydrated.  On Lamictal 200 mg daily.  She was cachectic with abdominal tenderness when seen in the emergency room.  She was also slightly anemic.  She was admitted from 8 18-8 24.  I reviewed Dr. Dr. Lorain Childes summary note from 03/29/2023: Have been seeing GI for progressive weight loss for multiyear history of epigastric pain, extensive workup done including HIDA scan, EGD, CT abdomen pelvis with contrast and lab work, recent EGD July 2024 showed biopsies consistent with celiac's disease and serology was also consistent with celiac disease she was started on gluten-free diet and she was also tried on multiple medications PPI, H2 blocker, dicyclomine and Carafate with no improvement.  Significantly reduced oral intake and suffered significant weight loss resulting in a BMI of 14.  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  She was hypotensive in the hospital.  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  All through this she didn't have a seizure but she did wke up once with numbness on the left side. She was down to 74. Always fatigued and tired. GI pain was worsening. She was in the hospital close to a week. She was very dehydrated, electrolyite and vitamin abnormalities. Trying to gain weight, doing better, she is 27, trying to get to 100 pounds, they are going to a dietician. Here with her father who also provides information. She took her lamictal today at 8am will do a lamictal level but not a trough.   Of concern labs taken 03/25/2023 include a low folate of 4.7, normal B1 of 90, normal B6 of 5.6, low vitamin B12 at 256, ferritin was 17 very low, magnesium normal.  Last thyroid was June 2024 0.861.  Last lamotrigine level was in June 2019 was normal at 8.1 we will test her again today. Itis not a trough, she took it this morning but will check. She is trying to eat. Now her stomach pain is much better being on a gluten free and lactose free diet. Needs to take calcium? She drinks almond mild. She fractured her tail bone last year. Should we check a dexa?  Interval history 06/05/2021: Doing well, stable, no problems, cont current therapy  CT head 2017: CLINICAL DATA:  Patient found down this  morning in grand mal seizure. Upper lip swelling and swelling about the right eye. Initial encounter.   EXAM: CT HEAD WITHOUT CONTRAST   CT MAXILLOFACIAL WITHOUT CONTRAST   CT CERVICAL SPINE WITHOUT CONTRAST   TECHNIQUE: Multidetector CT imaging of the head, cervical spine, and maxillofacial structures were performed using the standard protocol without intravenous contrast. Multiplanar CT image reconstructions of the cervical spine and maxillofacial structures were  also generated.   COMPARISON:  Brain MRI 09/24/2013.   FINDINGS: CT HEAD FINDINGS   Remote bilateral parietal lobe infarcts, worse on the left, are seen. There are also remote bilateral occipital lobe infarcts, worse on the right. No evidence of acute intracranial abnormality including hemorrhage, infarct, mass lesion, mass effect, midline shift or abnormal extra-axial fluid collection is identified. There is no hydrocephalus or pneumocephalus. Imaged paranasal sinuses and mastoid air cells are clear. The calvarium is intact.   CT MAXILLOFACIAL FINDINGS   No facial bone fracture is identified. The globes are intact and the lenses are located. The paranasal sinuses and mastoid air cells are clear.   CT CERVICAL SPINE FINDINGS   Vertebral body height and alignment are maintained in the cervical spine. 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 Lung apices are clear.   IMPRESSION: No acute abnormality head, face or cervical spine.   Remote bilateral parietal and occipital lobe infarcts.  Interval history February 09, 2019: patient is doing incredibly well, she has been very stable on her anti-epilepsy regimen.  Occasionally she will have a seizure aura but it never does progress to a seizure.  Today we discussed think she should just stay on the same regimen, she has been doing well, no side effects, she is very compliant and very lovely.  At this point we will continue her on her medications and follow-up with her in 6 months to a year.  Interval history: Doing well. She has had her aura numbness in the face and arm. This precedes a seizure. She has had several of these episodes. Very stereotyped. Often when eating. They are sporadic and have not progressed to a seizure. Getting less as Lamictal. Will get a Lamictal level but she already took the pill so will need to come back for a trough level. Appears to have these episodes before next Lamotrigine  is due, will switch to XR formulation and check trough level in 2 weeks   Interval history 12/31/2016: This is a 27 year old here for follow-up of seizures. 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 Three-day EEG was abnormal with right-sided sharp epileptiform discharges consistent with her left-sided symptoms. She was started on Lamictal with good results. At last appointment we titrated her to Lamictal 125 mg twice daily. She is here with her father who provides most information. She has had 3 incidents of her numbness and felt like she was going to have a seizure but didn't. Had one in January and another in February and one recently that felt like the precursor to her seizures. 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 Numbness in the left hand.  She has a job Psychologist, occupational. Discussed management and decided to increase Lamictal. She has an aid that helps her. She may be working at [employer]. She is no longer at  [employer]. She is getting her braces out soon.   Interval History 07/02/2016: She is doing well, no more seizures. They had a wonderful thanksgiving. She has had some numbness and blanking out. She felt a little numbness. She felt like she was going to have a seizure but did not have a seizure. Twice. Fingers are getting cold and the tips get white. Discussed we will increase her Lamictal to 125mg  twice daily. Discussed Raynaud's phenomenon.     Interval history 12/12/2015:  27 y.o. female here as a referral from Dr. Andrey Campanile for seizures. 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 MRI brain chronic hemorrhagic infarction in the parietal lobe bilaterally left greater than right and smaller areas of chronic infarction in the occipital lobes  bilaterally. Three-day prolonged EEG was abnormal with right-sided sharp epileptiform discharges seen consistent with her left-sided symptoms. She was started on Lamictal. Feeling better. She has had a few episodes, but not like before. She is in the process of titrating and is on 100mg  in the morning and 75mg  at night. Her blood pressure has been low. She doesn't drink in between meals. She drinks at meals only, maybe only 10oz a day. She is not drinking enough fluids. Friday she was dizzy, she gets light headed and nauseated, like she is going to pass out. Her BP is low today 84/53. They have a hard time getting her to eat and drink. Discussed taking in more fluids and salt. Her mood is better on the Lamictal. She feels happier, mood is better, not tired unless she has a full day. The last time she had an episode was several weeks ago and she was not on the full dose of the medication.  No sides effects to the Lamictal.    Interval history 10/26/2015: This is a lovely patient is here with her dad for follow-up after a 3 day ambulatory video EEG that was abnormal. Patient was started on Keppra at last appointment however she is having side effects including behavior changes. Her long discussion about epilepsy with father and patient. We'll start Lamictal. Lamictal is a great medication for both seizures and is also used for mood disorders so does not cause behavioral side effects like the Keppra. We'll continue Keppra while we titrating up on Lamictal.  PHYSICAN  EEG CONCLUSION/IMPRESSION: This was an abnormal prolonged ambulatory 72-hour video EEG.  Sharp epileptiform discharges seen in the right hemisphere -right fronto-temporal (F8/T4/T6), temporal (T4/T6 regions). These discharges were present while awake but became more frequent during sleep. Also seen during sleep were isolated bursts of sharp (as well as spike and wave), generalized, frontally-predominant discharges. These discharges were fairly  frequent. No electrographic or electroclinical events present. There was no focal or background slowing seen.   There were 4 push button events. These did not correlate with any changes on the EEG.   Owing to this prolonged VEEG being abnormal with focal abnormalities (right hemispheric discharges), if not recently obtained, a brain MRI and optimization of the anticonvulsant medication is recommended.   HPI:  Merredith Stano is a 27 y.o. female here as a referral from Dr. Andrey Campanile for seizures. Has a history of developmental delay and seizures, she was not on medication when see in the ED 09/08/2015 for gtcs. Never been on AEDs. Parents here and provide most information. She has anorexia. BP in the Ed was 86/69. She also has reported staring spells. She had her first seizure as an infant at 21  weeks. Then not again until 2 years ago when she had an episode of numbness on the left side and inability to speak.  She had another one a year ago and then recently. Last year she passed out eating lunch, they were told she had a seizure and her blood glucose was 40 and described as shaking on the floor. Teacher said this was def a seizure in March or April. 2 years ago it happened at the table c/o numbness on the left side face and arm, speech become impaired then it passes but she is aware and responsive and interactive and these happen sporadically once in a month.  Last Friday she was getting ready for work, dad heard a fall and vocalizations, she was twitching and on her side and had vomited, she was not responsive, no tongue biting, she was breathing, she was trying to speak but couldn't and trying to get up. Eyes were closed and fluttering a little, she heard his voice. She was sitting up in a few minutes and disoriented.She remembers eating and she noticed numbness in the left hand and left face and remembers falling and hitting the floor but she is not sure the next thing she remembers is the paramedics around  her. They started her on Keppra and sh eis a litle sleepy. She graduated high school. She is taking some online classes. She is seeing a therapist and has been told these episodes are due to stress.    Reviewed notes, labs and imaging from outside physicians, which showed:   Patient seen in the ED 09/08/2015. Per notes presenting to the ED with a grand mal seizure that occurred just PTA. Witnessed by patient's father, now at the bedside. Father states that the patient was sitting on a chair in the kitchen, fell to the floor, and began having full body convulsions. Positive for urinary incontinence and vomiting. Patient also complains of abdominal pain, which she indicates is chronic. Patient states that she hurts "a little bit." Pain is nonradiating and is accompanied by nausea. Pt has a history of seizures, but has not had one for over a year, and is not on medication for them. Pt and patient's parents confirm that the patient has never been on seizure medication before. 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 Put on OCPs and increased diet, period returned. EMS reports pt was CAOx4 upon their arrival with no seizure activity noted. Patient's parents at the bedside confirm pt is behaving normally. Pt is currently menstruating. Patient's parents also state that the patient has had intermittent "episodes" that include the patient's face unilaterally becoming numb, followed by a period of 30 seconds to 1 minute where the patient stares straight ahead and is not arousable.Her BP was 86/69 but she was asymptomatic.    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 Smaller areas of chronic infarction in the occipital lobes bilaterally. No acute  abnormality.   IMPRESSION: No acute abnormality head, face or cervical spine.  Remote bilateral parietal and occipital lobe infarcts.   Seen in the ED 2/3. Urine rapid drug screen negative, pregnancy test negative, CMP and CBC normal, EtOH negative.    Her emergency room notes on 09/08/2015, patient presented to the ED with  reports of generalized tonic-clonic seizure witnessed by patient's father. Father states that the patient was sitting on a chair in the kitchen and fell to the floor and began having full body convulsions. Positive for urinary incontinence and vomiting. EMS reported patient was alert and oriented 4 upon their arrival with no seizure activity noted. Patient was at baseline in the emergency room. He spoke with Dr. Amada Jupiter who started the patient on 500 mg twice a day of Keppra with outpatient follow-up in neurology.    Review of Systems: Patient complains of symptoms per HPI as well as the following symptoms: no rash, no CP, no SOB, no fatigue. Pertinent negatives and positives per HPI. All others negative.   Social History   Socioeconomic History   Marital status: Single    Spouse name: Not on file   Number of children: 0   Years of education: 12   Highest education level: Not on file  Occupational History   Occupation: [employer]  Tobacco Use   Smoking status: Never   Smokeless tobacco: Never  Vaping Use   Vaping status: Never Used  Substance and Sexual Activity   Alcohol use: No   Drug use: No   Sexual activity: Not on file  Other Topics Concern   Not on file  Social History Narrative   Lives with parents   Caffeine use: daily iced tea, mostly water   Right handed   Social Determinants of Health   Financial Resource Strain: Not on file  Food Insecurity: No Food Insecurity (03/23/2023)   Hunger Vital Sign    Worried About Running Out of Food in the Last Year: Never true    Ran Out of Food in the Last Year: Never true  Transportation Needs: No  Transportation Needs (03/23/2023)   PRAPARE - Administrator, Civil Service (Medical): No    Lack of Transportation (Non-Medical): No  Physical Activity: Not on file  Stress: Not on file  Social Connections: Not on file  Intimate Partner Violence: Not At Risk (03/23/2023)   Humiliation, Afraid, Rape, and Kick questionnaire    Fear of Current or Ex-Partner: No    Emotionally Abused: No    Physically Abused: No    Sexually Abused: No    Family History  Problem Relation Age of Onset   Diabetes Mother    Multiple sclerosis Mother    Breast cancer Maternal Grandmother    Heart disease Maternal Grandfather    Breast cancer Paternal Grandmother    Seizures Neg Hx    Colon cancer Neg Hx    Stomach cancer Neg Hx    Esophageal cancer Neg Hx    Pancreatic cancer Neg Hx    Liver disease Neg Hx     Past Medical History:  Diagnosis Date   Avoidant or restrictive food intake disorder    Hx of foot surgery 2008   reconstruction- right    Seizures (HCC)    47 weeks old    Past Surgical History:  Procedure Laterality Date   FOOT SURGERY Right 2008   reconstruction   TYMPANOSTOMY TUBE PLACEMENT      Current Outpatient Medications  Medication Sig Dispense Refill   Blood Glucose Monitoring Suppl DEVI 1 each by Does not apply route in the morning, at noon, and at bedtime. May substitute to any manufacturer covered by patient's insurance. 1 each 0   feeding supplement (ENSURE ENLIVE / ENSURE PLUS) LIQD Take 237 mLs by mouth 3 (three) times daily  between meals. 237 mL 12   Glucose Blood (BLOOD GLUCOSE TEST STRIPS) STRP 1 each by In Vitro route in the morning, at noon, and at bedtime. May substitute to any manufacturer covered by patient's insurance. 100 strip 0   Iron Carbonyl-Vitamin C-FOS (CHEWABLE IRON PO) Take 1 tablet by mouth daily.     Lancet Device MISC 1 each by Does not apply route in the morning, at noon, and at bedtime. May substitute to any manufacturer covered by  patient's insurance. 1 each 0   Lancets Misc. MISC 1 each by Does not apply route in the morning, at noon, and at bedtime. May substitute to any manufacturer covered by patient's insurance. 100 each 0   Misc. Devices (ROLLING WALKER/BURGUNDY) MISC 1 rolling walker for home use 1 each 0   multivitamin (PROSIGHT) TABS tablet Take 1 tablet by mouth daily. 30 tablet 0   Norgestim-Eth Estrad Triphasic (TRI-SPRINTEC PO) Take 1 tablet by mouth daily with supper.     ondansetron (ZOFRAN-ODT) 4 MG disintegrating tablet Take 4 mg by mouth 3 (three) times daily as needed.     senna-docusate (SENOKOT-S) 8.6-50 MG tablet Take 1 tablet by mouth at bedtime as needed for mild constipation. 30 tablet 0   sucralfate (CARAFATE) 1 GM/10ML suspension Take 10 mLs (1 g total) by mouth 2 (two) times daily. 420 mL 1   LamoTRIgine 200 MG TB24 24 hour tablet Take 1 tablet (200 mg total) by mouth 2 (two) times daily. 180 tablet 3   No current facility-administered medications for this visit.    Allergies as of 04/09/2023   (No Known Allergies)    Vitals: BP (!) 81/56   Pulse 81   Ht 5\' 2"  (1.575 m)   Wt 84 lb (38.1 kg)   BMI 15.36 kg/m  Last Weight:  Wt Readings from Last 1 Encounters:  04/09/23 84 lb (38.1 kg)   Last Height:   Ht Readings from Last 1 Encounters:  04/09/23 5\' 2"  (1.575 m)  Physical exam: Exam: Gen: NAD, conversant, well nourised, thin/cachectic, well groomed                     CV: RRR, no MRG. No Carotid Bruits. No peripheral edema, warm, nontender Eyes: Conjunctivae clear without exudates or hemorrhage  Neuro: Detailed Neurologic Exam  Speech:    Speech is normal; fluent and spontaneous with normal comprehension.  Cognition:    The patient is oriented to person, place, and time;     recent and remote memory intact;     language fluent;     normal attention, concentration,     fund of knowledge Cranial Nerves:    The pupils are equal, round, and reactive to light. ONH flat  Visual fields are full to finger confrontation. Extraocular movements are intact. Trigeminal sensation is intact and the muscles of mastication are normal. The face is symmetric. The palate elevates in the midline. Hearing intact. Voice is normal. Shoulder shrug is normal. The tongue has normal motion without fasciculations.   Coordination: nml  Gait: nml  Motor Observation:    No asymmetry, no atrophy, and no involuntary movements noted. Tone:    Normal muscle tone.    Posture:    Posture is normal. normal erect    Strength:    Strength is anti-gravity and equal in the upper and lower limbs without focal deficit     Sensation: intact to LT     Reflex Exam:  DTR's:  Deep tendon reflexes in the upper and lower extremities are summetrical bilaterally.   Toes:    The toes are downgoing bilaterally.   Clonus:    Clonus is absent.   Assessment/Plan:  This is a lovely 27 year old female here for follow-up of seizures with a past medical history of developmental delay and seizures secondary to in utero strokes. MRI of the brain showed chronic hemorrhagic infarctions in the parietal lobes bilaterally left greater than right and a smaller area of chronic infarction in the occipital lobes bilaterally. Three-day EEG showed right-sided sharp epileptiform discharges consistent with her auras which are left-sided numbness.  She was admitted this year in August for abdominal pain, she was followed by [facility] for this and recently had an endoscopy with biopsies that diagnosed her with celiac's disease.  She had gone from 105 pound to 75 pounds in the last several few months prior to admission.   She was admitted from 8 18-8 24.  I reviewed Dr. Dr. Lorain Childes summary note from 03/29/2023:  Significantly reduced oral intake and suffered significant weight loss resulting in a BMI of 14.  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  She was hypotensive in the hospital.  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  Continue lamictal 200mg  bid  Meds ordered this encounter  Medications   LamoTRIgine 200 MG TB24 24 hour tablet    Sig: Take 1 tablet (200 mg total) by mouth 2 (two) times daily.    Dispense:  180 tablet    Refill:  3   Orders Placed This Encounter  Procedures   DG BONE DENSITY (DXA)   B12 and Folate Panel   Methylmalonic acid, serum   Vitamin D, 25-hydroxy   Lamotrigine level   CBC with Differential/Platelets   Comprehensive metabolic panel   TSH Rfx on Abnormal to Free T4      Naomie Dean, MD  [facility] [address] [place], [place] [address]  Phone [phone] Fax [phone]  I spent over 40 minutes of face-to-face and non-face-to-face time with patient on the  1. Vitamin B12 deficiency   2. Vitamin D deficiency   3. Long-term use of high-risk medication   4. Fracture   5. Vitamin deficiency   6. Antiepileptic adverse reaction, initial encounter   7. screen for Osteopenia, unspecified location    diagnosis.  This included previsit chart review, lab review, study review, order entry, electronic health record documentation, patient education on the different diagnostic and therapeutic options, counseling and coordination of care, risks and benefits of management, compliance, or risk factor reduction

## 2023-04-10 LAB — TSH RFX ON ABNORMAL TO FREE T4: TSH: 1.24 u[IU]/mL (ref 0.450–4.500)

## 2023-04-12 LAB — COMPREHENSIVE METABOLIC PANEL
ALT: 14 IU/L (ref 0–32)
AST: 15 IU/L (ref 0–40)
Albumin: 4.3 g/dL (ref 4.0–5.0)
Alkaline Phosphatase: 85 IU/L (ref 44–121)
BUN/Creatinine Ratio: 14 (ref 9–23)
BUN: 10 mg/dL (ref 6–20)
Bilirubin Total: 0.3 mg/dL (ref 0.0–1.2)
CO2: 26 mmol/L (ref 20–29)
Calcium: 9.9 mg/dL (ref 8.7–10.2)
Chloride: 102 mmol/L (ref 96–106)
Creatinine, Ser: 0.73 mg/dL (ref 0.57–1.00)
Globulin, Total: 2.9 g/dL (ref 1.5–4.5)
Glucose: 47 mg/dL — ABNORMAL LOW (ref 70–99)
Potassium: 4.7 mmol/L (ref 3.5–5.2)
Sodium: 140 mmol/L (ref 134–144)
Total Protein: 7.2 g/dL (ref 6.0–8.5)
eGFR: 116 mL/min/{1.73_m2} (ref >59)

## 2023-04-12 LAB — CBC WITH DIFFERENTIAL/PLATELET
Basophils Absolute: 0 10*3/uL (ref 0.0–0.2)
Basos: 1 %
EOS (ABSOLUTE): 0.1 10*3/uL (ref 0.0–0.4)
Eos: 2 %
Hematocrit: 36 % (ref 34.0–46.6)
Hemoglobin: 11.3 g/dL (ref 11.1–15.9)
Immature Grans (Abs): 0 10*3/uL (ref 0.0–0.1)
Immature Granulocytes: 0 %
Lymphocytes Absolute: 1.4 10*3/uL (ref 0.7–3.1)
Lymphs: 24 %
MCH: 28.7 pg (ref 26.6–33.0)
MCHC: 31.4 g/dL — ABNORMAL LOW (ref 31.5–35.7)
MCV: 91 fL (ref 79–97)
Monocytes Absolute: 0.5 10*3/uL (ref 0.1–0.9)
Monocytes: 8 %
Neutrophils Absolute: 3.9 10*3/uL (ref 1.4–7.0)
Neutrophils: 65 %
Platelets: 528 10*3/uL — ABNORMAL HIGH (ref 150–450)
RBC: 3.94 x10E6/uL (ref 3.77–5.28)
RDW: 15 % (ref 11.7–15.4)
WBC: 6 10*3/uL (ref 3.4–10.8)

## 2023-04-12 LAB — VITAMIN D 25 HYDROXY (VIT D DEFICIENCY, FRACTURES): Vit D, 25-Hydroxy: 45.8 ng/mL (ref 30.0–100.0)

## 2023-04-12 LAB — B12 AND FOLATE PANEL
Folate: >20 ng/mL (ref >3.0)
Vitamin B-12: 479 pg/mL (ref 232–1245)

## 2023-04-12 LAB — LAMOTRIGINE LEVEL: Lamotrigine Lvl: 7.3 ug/mL (ref 2.0–20.0)

## 2023-04-12 LAB — METHYLMALONIC ACID, SERUM: Methylmalonic Acid: 103 nmol/L (ref 0–378)

## 2023-04-21 ENCOUNTER — Encounter: Payer: MEDICAID | Attending: Family Medicine | Admitting: Skilled Nursing Facility1

## 2023-04-21 VITALS — Wt 83.8 lb

## 2023-04-21 DIAGNOSIS — Z681 Body mass index (BMI) 19 or less, adult: Secondary | ICD-10-CM | POA: Insufficient documentation

## 2023-04-21 DIAGNOSIS — K900 Celiac disease: Secondary | ICD-10-CM | POA: Insufficient documentation

## 2023-04-21 DIAGNOSIS — Z713 Dietary counseling and surveillance: Secondary | ICD-10-CM | POA: Diagnosis not present

## 2023-04-21 DIAGNOSIS — K909 Intestinal malabsorption, unspecified: Secondary | ICD-10-CM | POA: Insufficient documentation

## 2023-04-21 DIAGNOSIS — D508 Other iron deficiency anemias: Secondary | ICD-10-CM | POA: Insufficient documentation

## 2023-04-21 NOTE — Progress Notes (Unsigned)
Medical Nutrition Therapy  Appointment Start time:  10:12  Appointment End time:  11:12  Primary concerns today: Celiac   Referral diagnosis: Celiac  Preferred learning style: hands on Learning readiness: change in progress   NUTRITION ASSESSMENT    Clinical Medical Hx:  Past Medical History:  Diagnosis Date   Avoidant or restrictive food intake disorder    Hx of foot surgery 2008   reconstruction- right    Seizures (HCC)    34 weeks old   Medications:  Current Outpatient Medications on File Prior to Visit  Medication Sig Dispense Refill   Blood Glucose Monitoring Suppl DEVI 1 each by Does not apply route in the morning, at noon, and at bedtime. May substitute to any manufacturer covered by patient's insurance. 1 each 0   feeding supplement (ENSURE ENLIVE / ENSURE PLUS) LIQD Take 237 mLs by mouth 3 (three) times daily between meals. 237 mL 12   Glucose Blood (BLOOD GLUCOSE TEST STRIPS) STRP 1 each by In Vitro route in the morning, at noon, and at bedtime. May substitute to any manufacturer covered by patient's insurance. 100 strip 0   Iron Carbonyl-Vitamin C-FOS (CHEWABLE IRON PO) Take 1 tablet by mouth daily.     LamoTRIgine 200 MG TB24 24 hour tablet Take 1 tablet (200 mg total) by mouth 2 (two) times daily. 180 tablet 3   Lancet Device MISC 1 each by Does not apply route in the morning, at noon, and at bedtime. May substitute to any manufacturer covered by patient's insurance. 1 each 0   Lancets Misc. MISC 1 each by Does not apply route in the morning, at noon, and at bedtime. May substitute to any manufacturer covered by patient's insurance. 100 each 0   Misc. Devices (ROLLING WALKER/BURGUNDY) MISC 1 rolling walker for home use 1 each 0   multivitamin (PROSIGHT) TABS tablet Take 1 tablet by mouth daily. 30 tablet 0   Norgestim-Eth Estrad Triphasic (TRI-SPRINTEC PO) Take 1 tablet by mouth daily with supper.     ondansetron (ZOFRAN-ODT) 4 MG disintegrating tablet Take 4 mg by  mouth 3 (three) times daily as needed.     senna-docusate (SENOKOT-S) 8.6-50 MG tablet Take 1 tablet by mouth at bedtime as needed for mild constipation. 30 tablet 0   sucralfate (CARAFATE) 1 GM/10ML suspension Take 10 mLs (1 g total) by mouth 2 (two) times daily. 420 mL 1   No current facility-administered medications on file prior to visit.    Labs: MCHC 31.4, platelets 528, glucose 47 Notable Signs/Symptoms: Malnutrition   Lifestyle & Dietary Hx  Pt arrives with her supportive father.  Pt and her father and mother are doing really great with identifying gluten and avoiding it completely.   Pt states she wants to gain weight, Pt states she is allergic to gluten and not sure if she is lactose intolerant. Pt states she drinks almond milk and lactose free milk. Pts father states in the past 5 months they have been gluten free but abdominal pain continues.  Florastine has a long hx of abdominal pain and nausea.   Ramya states she loves regimen and structure.  Pt states peanuts bother her stomach, pt states butter hurts her stomach.   We are hopeful all of her pain and malnutrition was from Celiac and now that wheat is cut out she will improve and be able to gain the neccessary weight.   Estimated daily fluid intake:  oz Supplements: taking mirilax every other day,  Sleep:  Stress / self-care:  Current average weekly physical activity:   24-Hr Dietary Recall First Meal 8am: GF waffle + syrup and pecans + lactose milk or cereal with raisins  Snack 11am: cashews  Second Meal: cheese sandwich bacon on gluten free bread + mayo with lettuce + cookie or cake or candy corn  Snack: nuts  Third Meal 6: gluten free sandwich pulled pork + chips Snack 8-9: ensure plant based Beverages: almond milk, applejuice, peach nectar, water    NUTRITION DIAGNOSIS  [place]-3.1 Underweight As related to newly diagnosed Celiac.  As evidenced by BMI 15.33 and malnutrition.   NUTRITION INTERVENTION  Nutrition  education (E-1) on the following topics:  Celiac pathophysiology Wheat and how to identify in food supply in addition to cross contamination   Handouts Provided Include  Celiac and how to avoid wheat Eating to gain weight within the context of celiac   Learning Style & Readiness for Change Teaching method utilized: Visual & Auditory  Demonstrated degree of understanding via: Teach Back  Barriers to learning/adherence to lifestyle change: unidentified   Goals Established by Pt Change to full fat milk Aim for 2 nutrition drinks per day Try Soylent Try 1 T of cottage cheese  2T of almond butter with breakfast is good  Try alkaline water pH 8.8 Snacks: dried edamame, pretzel and almond butter Try avocado or hummus on your sandwiches  Limit plain water to 20 ounces for now Keep eating or nutrition drinking every 3 hours    MONITORING & EVALUATION Dietary intake, weekly physical activity  Next Steps  Patient is to follow up in 4 weeks.

## 2023-05-27 ENCOUNTER — Encounter: Payer: MEDICAID | Attending: Family Medicine | Admitting: Dietician

## 2023-05-27 VITALS — Wt 87.1 lb

## 2023-05-27 DIAGNOSIS — K900 Celiac disease: Secondary | ICD-10-CM | POA: Insufficient documentation

## 2023-05-27 NOTE — Patient Instructions (Signed)
Previous Goals: Change to full fat milk - great job! Aim for 2 nutrition drinks per day - goal not met, aim for at least 1.  Try Soylent 2T of almond butter with breakfast  Try alkaline water pH 8.8 Snacks: dried edamame, pretzel and almond butter Try avocado or hummus on your sandwiches  Keep eating or nutrition drinking every 3 hours   New Goals: Drink at least 32 oz of water over the day. Drink 1/2 of your shake at morning snack and half at afternoon.  If you can't tolerate the nutrition drinks have a glass of the lactaid milk at lunch or dinner (or snacks)

## 2023-05-27 NOTE — Progress Notes (Signed)
Medical Nutrition Therapy  Appointment Start time:  [phone]  Appointment End time:  0930  Primary concerns today: Celiac   Referral diagnosis: Celiac  Preferred learning style: hands on Learning readiness: change in progress   NUTRITION ASSESSMENT    Clinical Medical Hx:  Past Medical History:  Diagnosis Date   Avoidant or restrictive food intake disorder    Hx of foot surgery 2008   reconstruction- right    Seizures (HCC)    83 weeks old   Medications:  Current Outpatient Medications on File Prior to Visit  Medication Sig Dispense Refill   Blood Glucose Monitoring Suppl DEVI 1 each by Does not apply route in the morning, at noon, and at bedtime. May substitute to any manufacturer covered by patient's insurance. 1 each 0   feeding supplement (ENSURE ENLIVE / ENSURE PLUS) LIQD Take 237 mLs by mouth 3 (three) times daily between meals. 237 mL 12   Glucose Blood (BLOOD GLUCOSE TEST STRIPS) STRP 1 each by In Vitro route in the morning, at noon, and at bedtime. May substitute to any manufacturer covered by patient's insurance. 100 strip 0   Iron Carbonyl-Vitamin C-FOS (CHEWABLE IRON PO) Take 1 tablet by mouth daily.     LamoTRIgine 200 MG TB24 24 hour tablet Take 1 tablet (200 mg total) by mouth 2 (two) times daily. 180 tablet 3   Misc. Devices (ROLLING WALKER/BURGUNDY) MISC 1 rolling walker for home use 1 each 0   multivitamin (PROSIGHT) TABS tablet Take 1 tablet by mouth daily. 30 tablet 0   Norgestim-Eth Estrad Triphasic (TRI-SPRINTEC PO) Take 1 tablet by mouth daily with supper.     ondansetron (ZOFRAN-ODT) 4 MG disintegrating tablet Take 4 mg by mouth 3 (three) times daily as needed.     sucralfate (CARAFATE) 1 GM/10ML suspension Take 10 mLs (1 g total) by mouth 2 (two) times daily. 420 mL 1   No current facility-administered medications on file prior to visit.   Labs: MCHC 31.4, platelets 528, glucose 47 Notable Signs/Symptoms: Malnutrition   Wt Readings from Last 3 Encounters:   05/27/23 87 lb 1.6 oz (39.5 kg)  04/21/23 83 lb 12.8 oz (38 kg)  04/09/23 84 lb (38.1 kg)   Lifestyle & Dietary Hx  Pt present today with dad.  Pt states things have been going well. Pt reports she has been drinking lactaid milk instead of almond milk, and states she has been tolerating it well. Pt reports she switched to whole milk version of lactaid. Pt states she has also tried soy milk and tolerated it but flavor was vanilla and she does not like vanilla. Pt states she tried 1T of cottage cheese and had stomach issues so she got lactose free cottage cheese and tolerated it fine.  Dad states pt has been eating gluten free since the spring but is still having abdominal discomfort. Pt states she thinks it has reduced but still occurring. Dad reports they have been keeping cross contamination to a minimum including using separate toasters and storing bread in different areas.   Pt is happy today to have gained some weight. Dad reports pt has not been doing well with drinking the plant based ensures. Pt states it makes her stomach hurt.  Pt reports she is not big on fruit and likes more vegetables. Reports she eats lettuce and spinach.     Estimated daily fluid intake: 32 oz Supplements: taking miralax every other day Sleep: pt states she sleeps good.  Stress / self-care: not  assessed Current average weekly physical activity:   24-Hr Dietary Recall First Meal 8am: GF waffle and and handful of pecans and maple syrup and cup of lactose free milk Snack 11:30am: cashews and raisins (handful) Second Meal: [place] and cheese sandwich with mayo on GF bread and vanilla wafers  Snack: raisins and walnuts Third Meal 6pm: fried egg sandwich and french fries Snack: none Beverages: lactose free milk, 18 oz apple juice, water    NUTRITION DIAGNOSIS  [place]-3.1 Underweight As related to newly diagnosed Celiac.  As evidenced by BMI 15.33 and malnutrition.   NUTRITION INTERVENTION  Nutrition  education (E-1) on the following topics:  Impact of consuming gluten on the small intestine: this can damage the villi of the small intestine which can make it harder to absorb certain nutrients, including fat, which can lead to fat malabsorption.  Discussed strategies to continue increasing overall calorie and protein intake including having half an ensure with each snack, and if unable to tolerate having a glass of lactose free whole milk. Encouraged pt to continue increasing water intake, as pt met previous goal of 20 oz per day and now aiming to drink 32 oz of water daily.  Discussed importance of intake of fruits and vegetables, including the impact of fiber on the GI tract and also the role of water in this process.   Handouts Provided Include  Previous visit:  Celiac and how to avoid wheat Eating to gain weight within the context of celiac   Learning Style & Readiness for Change Teaching method utilized: Visual & Auditory  Demonstrated degree of understanding via: Teach Back  Barriers to learning/adherence to lifestyle change: unidentified   Goals Established by Pt  Previous Goals:  Change to full fat milk - goal met. great job! Aim for 2 nutrition drinks per day - goal not met, aim for at least 1.  Try Soylent - hasn't tried, dad states planning to buy soon 2T of almond butter with breakfast is good  Try alkaline water pH 8.8 Snacks: dried edamame, pretzel and almond butter Try avocado or hummus on your sandwiches - tried hummus, did not tolerate Keep eating or nutrition drinking every 3 hours   New Goals: Drink at least 32 oz of water over the day. Drink 1/2 of your shake at morning snack and half at afternoon.  If you can't tolerate the nutrition drinks have a glass of the lactaid milk at lunch or dinner (or snacks)  MONITORING & EVALUATION Dietary intake, weekly physical activity  Next Steps  Patient is to follow up in 8 weeks.

## 2023-05-29 ENCOUNTER — Inpatient Hospital Stay: Payer: MEDICAID | Admitting: Nurse Practitioner

## 2023-05-29 ENCOUNTER — Inpatient Hospital Stay: Payer: MEDICAID | Attending: Nurse Practitioner

## 2023-06-05 ENCOUNTER — Ambulatory Visit: Payer: MEDICAID | Admitting: Internal Medicine

## 2023-06-06 ENCOUNTER — Ambulatory Visit: Payer: MEDICAID | Admitting: Dietician

## 2023-06-12 ENCOUNTER — Telehealth: Payer: Self-pay

## 2023-06-12 NOTE — Telephone Encounter (Signed)
Patients father called to cancel appointment on 06/19/23 and did not wish to reschedule. Father stated he would call back at a later date to schedule. 06/12/23 @ 12:20PM

## 2023-06-19 ENCOUNTER — Inpatient Hospital Stay: Payer: MEDICAID | Attending: Nurse Practitioner | Admitting: Nurse Practitioner

## 2023-06-19 ENCOUNTER — Inpatient Hospital Stay: Payer: MEDICAID

## 2023-06-24 ENCOUNTER — Encounter: Payer: Self-pay | Admitting: Internal Medicine

## 2023-06-24 ENCOUNTER — Ambulatory Visit (INDEPENDENT_AMBULATORY_CARE_PROVIDER_SITE_OTHER): Payer: MEDICAID | Admitting: Internal Medicine

## 2023-06-24 ENCOUNTER — Other Ambulatory Visit (INDEPENDENT_AMBULATORY_CARE_PROVIDER_SITE_OTHER): Payer: MEDICAID

## 2023-06-24 VITALS — BP 80/60 | HR 80 | Ht 61.0 in | Wt 89.1 lb

## 2023-06-24 DIAGNOSIS — K900 Celiac disease: Secondary | ICD-10-CM | POA: Diagnosis not present

## 2023-06-24 DIAGNOSIS — R1013 Epigastric pain: Secondary | ICD-10-CM

## 2023-06-24 DIAGNOSIS — D508 Other iron deficiency anemias: Secondary | ICD-10-CM | POA: Diagnosis not present

## 2023-06-24 DIAGNOSIS — R112 Nausea with vomiting, unspecified: Secondary | ICD-10-CM

## 2023-06-24 LAB — CBC WITH DIFFERENTIAL/PLATELET
Basophils Absolute: 0 10*3/uL (ref 0.0–0.1)
Basophils Relative: 0.5 % (ref 0.0–3.0)
Eosinophils Absolute: 0.2 10*3/uL (ref 0.0–0.7)
Eosinophils Relative: 2.8 % (ref 0.0–5.0)
HCT: 38.6 % (ref 36.0–46.0)
Hemoglobin: 12.7 g/dL (ref 12.0–15.0)
Lymphocytes Relative: 24.5 % (ref 12.0–46.0)
Lymphs Abs: 1.6 10*3/uL (ref 0.7–4.0)
MCHC: 32.9 g/dL (ref 30.0–36.0)
MCV: 90.7 fL (ref 78.0–100.0)
Monocytes Absolute: 0.4 10*3/uL (ref 0.1–1.0)
Monocytes Relative: 6.3 % (ref 3.0–12.0)
Neutro Abs: 4.2 10*3/uL (ref 1.4–7.7)
Neutrophils Relative %: 65.9 % (ref 43.0–77.0)
Platelets: 327 10*3/uL (ref 150.0–400.0)
RBC: 4.26 Mil/uL (ref 3.87–5.11)
RDW: 13.9 % (ref 11.5–15.5)
WBC: 6.4 10*3/uL (ref 4.0–10.5)

## 2023-06-24 LAB — FERRITIN: Ferritin: 10.3 ng/mL (ref 10.0–291.0)

## 2023-06-24 NOTE — Patient Instructions (Signed)
Your provider has requested that you go to the basement level for lab work before leaving today. Press "B" on the elevator. The lab is located at the first door on the left as you exit the elevator.  Please follow up in 6 months.  _______________________________________________________  If your blood pressure at your visit was 140/90 or greater, please contact your primary care physician to follow up on this.  _______________________________________________________  If you are age 27 or older, your body mass index should be between 23-30. Your Body mass index is 16.84 kg/m. If this is out of the aforementioned range listed, please consider follow up with your Primary Care Provider.  If you are age 15 or younger, your body mass index should be between 19-25. Your Body mass index is 16.84 kg/m. If this is out of the aformentioned range listed, please consider follow up with your Primary Care Provider.   ________________________________________________________  The [place] [facility] providers would like to encourage you to use [facility] to communicate with providers for non-urgent requests or questions.  Due to long hold times on the telephone, sending your provider a message by [facility] may be a faster and more efficient way to get a response.  Please allow 48 business hours for a response.  Please remember that this is for non-urgent requests.  _______________________________________________________

## 2023-06-24 NOTE — Progress Notes (Signed)
HISTORY OF PRESENT ILLNESS:  Martha Wells is a 27 y.o. 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  After a hiatus from this office, she was reevaluated February 21, 2023.  She was set up for upper endoscopy.  She was noted to have microcytic anemia consistent with iron deficiency.  Upper endoscopy was performed February 25, 2023.  Examination was grossly normal.  However duodenal biopsies revealed changes consistent with celiac sprue.  Subsequent tissue transglutaminase antibody IgA was markedly elevated.  She underwent other testing including CT angiogram to rule out.  Mesenteric artery syndrome.  Her superior mesenteric artery distance was measured at 4.6 mm.  She was seen by hematology regarding her anemia.  Seen by nutrition regarding celiac disease.  Has been on a gluten-free diet.  Presents today for requested follow-up.  She is companied by her father.  She is feeling markedly better.  She has gained 10 pounds.  Occasional nausea and transient abdominal discomfort, but not severe.  No vomiting.  They are working diligently regarding a gluten-free diet.  Taking a multivitamin with iron.  Does not have scheduled hematology follow-up per hematology.  Continues to see nutrition on a regular basis.  Next appointment in January.  Blood work from April 09, 2023 shows normal liver test.  Albumin 4.3.  Hemoglobin 11.3.  MCV 91.  REVIEW OF SYSTEMS:  All non-GI ROS negative. Past Medical History:  Diagnosis Date   Avoidant or restrictive food intake disorder    Hx of foot surgery 2008   reconstruction- right    Seizures (HCC)    69 weeks old    Past Surgical History:  Procedure Laterality Date   FOOT SURGERY Right 2008   reconstruction   TYMPANOSTOMY TUBE PLACEMENT      Social History Dominick Stoneman Levenhagen  reports that she has never  smoked. She has never used smokeless tobacco. She reports that she does not drink alcohol and does not use drugs.  family history includes Breast cancer in her maternal grandmother and paternal grandmother; Diabetes in her mother; Heart disease in her maternal grandfather; Multiple sclerosis in her mother.  No Known Allergies     PHYSICAL EXAMINATION: Vital signs: BP (!) 80/60 (BP Location: Left Arm, Patient Position: Sitting, Cuff Size: Normal)   Pulse 80   Ht 5\' 1"  (1.549 m)   Wt 89 lb 2 oz (40.4 kg)   BMI 16.84 kg/m  General: Pleasant, thin female, no acute distress HEENT: Sclerae are anicteric, conjunctiva pink. Oral mucosa intact Lungs: Clear Heart: Regular Abdomen: soft, nontender, nondistended, no obvious ascites, no peritoneal signs, normal bowel sounds. No organomegaly. Extremities: No edema Psychiatric: alert and oriented x3. Cooperative   ASSESSMENT:  1.  Celiac sprue.  Now on gluten-free diet.  GI symptoms improving significantly. 2.  Iron deficiency anemia secondary to celiac disease.  Improved. 3.  Chronic abdominal complaints secondary to celiac disease.  Improved. 4.  Weight loss secondary to celiac disease.  Improved.   PLAN:  1.  CBC and ferritin today 2.  Tissue transglutaminase antibody IgA today. 3.  Continue strict gluten avoidance. 4.  Continue close follow-up with nutrition 5.  Continue multivitamin with iron 6.  Routine GI follow-up 6 months. Total time of 30 minutes was spent preparing to see the patient, obtaining interval history, performing medically appropriate physical examination, counseling and educating the patient  and her father regarding the above issues, ordering blood work, arranging follow-up, and documenting clinical information in the health record.

## 2023-06-25 LAB — TISSUE TRANSGLUTAMINASE, IGA: (tTG) Ab, IgA: 113.1 U/mL — ABNORMAL HIGH

## 2023-08-27 ENCOUNTER — Ambulatory Visit: Payer: MEDICAID | Admitting: Dietician

## 2023-10-08 ENCOUNTER — Telehealth (INDEPENDENT_AMBULATORY_CARE_PROVIDER_SITE_OTHER): Payer: MEDICAID | Admitting: Neurology

## 2023-10-08 DIAGNOSIS — G40209 Localization-related (focal) (partial) symptomatic epilepsy and epileptic syndromes with complex partial seizures, not intractable, without status epilepticus: Secondary | ICD-10-CM | POA: Diagnosis not present

## 2023-10-08 DIAGNOSIS — R6250 Unspecified lack of expected normal physiological development in childhood: Secondary | ICD-10-CM | POA: Diagnosis not present

## 2023-10-12 ENCOUNTER — Encounter: Payer: Self-pay | Admitting: Neurology

## 2023-10-12 MED ORDER — LAMOTRIGINE ER 200 MG PO TB24: 1.0000 | ORAL_TABLET | Freq: Two times a day (BID) | ORAL | 3 refills | Status: AC

## 2023-10-12 NOTE — Progress Notes (Signed)
 [facility]    Provider:  Dr Lucia Gaskins Referring Provider: Barbie Banner, MD Primary Care Physician:  Barbie Banner, MD    CC:  focal epilepsy  Virtual Visit via Video Note  I connected with Martha Wells on 10/08/2023 at  3:00 PM EST by a video enabled telemedicine application and verified that I am speaking with the correct person using two identifiers.  Location: Patient: home with father and mother in attendance and providing information Provider: office   I discussed the limitations of evaluation and management by telemedicine and the availability of in person appointments. The patient expressed understanding and agreed to proceed.   Follow Up Instructions:    I discussed the assessment and treatment plan with the patient. The patient was provided an opportunity to ask questions and all were answered. The patient agreed with the plan and demonstrated an understanding of the instructions.   The patient was advised to call back or seek an in-person evaluation if the symptoms worsen or if the condition fails to improve as anticipated.  I provided 30 minutes of non-face-to-face time during this encounter.   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  Patient mother and father are on video today.  Patient is doing exceptionally well, she has gained weight, she appears and reports that she is very happy and healthy.  She is stable on her antiseizure medication, she is compliant, she will have seizure auras which have been ongoing but never progressed from the aura, she does not drive, she feels extremely well.  We discussed continuing current regimen.  It has been a months since she has had a seizure aura, it is very brief, and I discussed whether it was frequent or if it was distressing and it does not happen often and it is very brief and at this point we can  maintain her current regimen.  I did discuss with her to keep a journal of when it happens, how long it lasts, what are the symptoms and we can further discuss it and also timing whether it is during her period as Lamictal can fluctuate with estrogen levels and explained this to patient and family.  No other focal neurologic deficits, associated symptoms, inciting events or modifiable factors.  Patient complains of symptoms per HPI as well as the following symptoms: none . Pertinent negatives and positives per HPI. 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  Doing very well for many years, but she was admitted this year in August for abdominal pain, she was followed by [facility] for this and recently had an endoscopy with biopsies that diagnosed her with celiac's disease.  And March 23, 2023 she had gone from 105 pound to 75 pounds in the last several few months prior.  Painful to eat.  Severe burning sensation after food.  Failed and tried numerous reflux medications.  No vomiting.  Very weak.  Dehydrated.  On Lamictal 200 mg daily.  She was cachectic with abdominal tenderness when seen in the emergency room.  She was also slightly anemic.  She was admitted from 8 18-8 24.  I reviewed Dr. Dr. Lorain Childes summary note from 03/29/2023: Have  been seeing GI for progressive weight loss for multiyear history of epigastric pain, extensive workup done including HIDA scan, EGD, CT abdomen pelvis with contrast and lab work, recent EGD July 2024 showed biopsies consistent with celiac's disease and serology was also consistent with celiac disease she was started on gluten-free diet and she was also tried on multiple medications PPI, H2 blocker, dicyclomine and Carafate with no improvement.   Significantly reduced oral intake and suffered significant weight loss resulting in a BMI of 14.  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  She was hypotensive in the hospital.  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  All through this she didn't have a seizure but she did wke up once with numbness on the left side. She was down to 74. Always fatigued and tired. GI pain was worsening. She was in the hospital close to a week. She was very dehydrated, electrolyite and vitamin abnormalities. Trying to gain weight, doing better, she is 73, trying to get to 100 pounds, they are going to a dietician. Here with her father who also provides information. She took her lamictal today at 8am will do a lamictal level but not a trough.   Of concern labs taken 03/25/2023 include a low folate of 4.7, normal B1 of 90, normal B6 of 5.6, low vitamin B12 at 256, ferritin was 17 very low, magnesium normal.  Last thyroid was June 2024 0.861.  Last lamotrigine level was in June 2019 was normal at 8.1 we will test her again today. Itis not a trough, she took it this morning but will check. She is trying to eat. Now her stomach pain is much better being on a gluten free and lactose free diet. Needs to take calcium? She drinks almond mild. She fractured her tail bone last year. Should we check a dexa?  Interval history 06/05/2021: Doing well, stable, no problems, cont current therapy  CT head 2017: CLINICAL DATA:  Patient found down this morning in grand mal seizure. Upper lip swelling and swelling about the right eye. Initial  encounter.   EXAM: CT HEAD WITHOUT CONTRAST   CT MAXILLOFACIAL WITHOUT CONTRAST   CT CERVICAL SPINE WITHOUT CONTRAST   TECHNIQUE: Multidetector CT imaging of the head, cervical spine, and maxillofacial structures were performed using the standard protocol without intravenous contrast. Multiplanar CT image reconstructions of the cervical spine and maxillofacial structures were also generated.   COMPARISON:  Brain MRI 09/24/2013.   FINDINGS: CT HEAD FINDINGS   Remote bilateral parietal lobe infarcts, worse on the left, are seen. There are also remote bilateral occipital lobe infarcts, worse on the right. No evidence of acute intracranial abnormality including hemorrhage, infarct, mass lesion, mass effect, midline shift or abnormal extra-axial fluid collection is identified. There is no hydrocephalus or pneumocephalus. Imaged paranasal sinuses and mastoid air cells are clear. The calvarium is intact.   CT MAXILLOFACIAL FINDINGS   No facial bone fracture is identified. The globes are intact and the lenses are located. The paranasal sinuses and mastoid air cells are clear.   CT CERVICAL SPINE FINDINGS   Vertebral body height and alignment are maintained in the  cervical spine. 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 Lung apices are clear.   IMPRESSION: No acute abnormality head, face or cervical spine.   Remote bilateral parietal and occipital lobe infarcts.  Interval history February 09, 2019: patient is doing incredibly well, she has been very stable on her anti-epilepsy regimen.  Occasionally she will have a seizure aura but it never does progress to a seizure.  Today we discussed think she should just stay on the same regimen, she has been doing well, no side effects, she is very compliant and very lovely.  At this point we will continue her on her medications and follow-up with her in 6 months to a year.  Interval history: Doing  well. She has had her aura numbness in the face and arm. This precedes a seizure. She has had several of these episodes. Very stereotyped. Often when eating. They are sporadic and have not progressed to a seizure. Getting less as Lamictal. Will get a Lamictal level but she already took the pill so will need to come back for a trough level. Appears to have these episodes before next Lamotrigine is due, will switch to XR formulation and check trough level in 2 weeks   Interval history 12/31/2016: This is a 28 year old here for follow-up of seizures. 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 Three-day EEG was abnormal with right-sided sharp epileptiform discharges consistent with her left-sided symptoms. She was started on Lamictal with good results. At last appointment we titrated her to Lamictal 125 mg twice daily. She is here with her father who provides most information. She has had 3 incidents of her numbness and felt like she was going to have a seizure but didn't. Had one in January and another in February and one recently that felt like the precursor to her seizures. 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 Numbness in the left hand.  She has a job Psychologist, occupational. Discussed management and decided to increase Lamictal. She has an aid that helps her. She may be working at [employer]. She is no longer at [place]. She is getting her braces out soon.   Interval History 07/02/2016: She is doing well, no more seizures. They had a wonderful thanksgiving. She has had some numbness and blanking out. She felt a little numbness. She felt like she was going to have a seizure but did not have a seizure. Twice. Fingers are getting cold and the tips get white. Discussed we will increase her Lamictal to  125mg  twice daily. Discussed Raynaud's phenomenon.     Interval history 12/12/2015:  28 y.o. female here as a referral from Dr. Andrey Campanile for seizures. 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 Three-day prolonged EEG was abnormal with right-sided sharp epileptiform discharges seen consistent with her left-sided symptoms. She was started on Lamictal. Feeling better. She has had a few episodes, but not like before. She is in the process of titrating and is on 100mg  in the morning and 75mg  at night. Her blood pressure has been low. She doesn't drink in between meals. She drinks at  meals only, maybe only 10oz a day. She is not drinking enough fluids. Friday she was dizzy, she gets light headed and nauseated, like she is going to pass out. Her BP is low today 84/53. They have a hard time getting her to eat and drink. Discussed taking in more fluids and salt. Her mood is better on the Lamictal. She feels happier, mood is better, not tired unless she has a full day. The last time she had an episode was several weeks ago and she was not on the full dose of the medication.  No sides effects to the Lamictal.    Interval history 10/26/2015: This is a lovely patient is here with her dad for follow-up after a 3 day ambulatory video EEG that was abnormal. Patient was started on Keppra at last appointment however she is having side effects including behavior changes. Her long discussion about epilepsy with father and patient. We'll start Lamictal. Lamictal is a great medication for both seizures and is also used for mood disorders so does not cause behavioral side effects like the Keppra. We'll continue Keppra while we titrating up on Lamictal.  PHYSICAN  EEG CONCLUSION/IMPRESSION: This was an abnormal prolonged  ambulatory 72-hour video EEG.  Sharp epileptiform discharges seen in the right hemisphere -right fronto-temporal (F8/T4/T6), temporal (T4/T6 regions). These discharges were present while awake but became more frequent during sleep. Also seen during sleep were isolated bursts of sharp (as well as spike and wave), generalized, frontally-predominant discharges. These discharges were fairly frequent. No electrographic or electroclinical events present. There was no focal or background slowing seen.   There were 4 push button events. These did not correlate with any changes on the EEG.   Owing to this prolonged VEEG being abnormal with focal abnormalities (right hemispheric discharges), if not recently obtained, a brain MRI and optimization of the anticonvulsant medication is recommended.   HPI:  Suman Trivedi is a 28 y.o. female here as a referral from Dr. Andrey Campanile for seizures. Has a history of developmental delay and seizures, she was not on medication when see in the ED 09/08/2015 for gtcs. Never been on AEDs. Parents here and provide most information. She has anorexia. BP in the Ed was 86/69. She also has reported staring spells. She had her first seizure as an infant at 6 weeks. Then not again until 2 years ago when she had an episode of numbness on the left side and inability to speak.  She had another one a year ago and then recently. Last year she passed out eating lunch, they were told she had a seizure and her blood glucose was 40 and described as shaking on the floor. Teacher said this was def a seizure in March or April. 2 years ago it happened at the table c/o numbness on the left side face and arm, speech become impaired then it passes but she is aware and responsive and interactive and these happen sporadically once in a month.  Last Friday she was getting ready for work, dad heard a fall and vocalizations, she was twitching and on her side and had vomited, she was not responsive, no tongue  biting, she was breathing, she was trying to speak but couldn't and trying to get up. Eyes were closed and fluttering a little, she heard his voice. She was sitting up in a few minutes and disoriented.She remembers eating and she noticed numbness in the left hand and left face and remembers falling and hitting the floor but  she is not sure the next thing she remembers is the paramedics around her. They started her on Keppra and sh eis a litle sleepy. She graduated high school. She is taking some online classes. She is seeing a therapist and has been told these episodes are due to stress.    Reviewed notes, labs and imaging from outside physicians, which showed:   Patient seen in the ED 09/08/2015. Per notes presenting to the ED with a grand mal seizure that occurred just PTA. Witnessed by patient's father, now at the bedside. Father states that the patient was sitting on a chair in the kitchen, fell to the floor, and began having full body convulsions. Positive for urinary incontinence and vomiting. Patient also complains of abdominal pain, which she indicates is chronic. Patient states that she hurts "a little bit." Pain is nonradiating and is accompanied by nausea. Pt has a history of seizures, but has not had one for over a year, and is not on medication for them. Pt and patient's parents confirm that the patient has never been on seizure medication before. 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 Put on OCPs and increased diet, period returned. EMS reports pt was CAOx4 upon their arrival with no seizure activity noted. Patient's parents at the bedside confirm pt is behaving normally. Pt is currently menstruating. Patient's parents also state that the patient has had intermittent "episodes" that include the patient's face unilaterally becoming numb, followed by a period  of 30 seconds to 1 minute where the patient stares straight ahead and is not arousable.Her BP was 86/69 but she was asymptomatic.    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 Smaller areas of chronic infarction in the occipital lobes bilaterally. No acute abnormality.   IMPRESSION: No acute abnormality head, face or cervical spine.  Remote bilateral parietal and occipital lobe infarcts.   Seen in the ED 2/3. Urine rapid drug screen negative, pregnancy test negative, CMP and CBC normal, EtOH negative.    Her emergency room notes on 09/08/2015, patient presented to the ED with reports of generalized tonic-clonic seizure witnessed by patient's father. Father states that the patient was sitting on a chair in the kitchen and fell to the floor and began having full body convulsions. Positive for urinary incontinence and vomiting. EMS reported patient was alert and oriented 4 upon their arrival with no seizure activity noted. Patient was at baseline in the emergency room. He spoke with Dr. Amada Jupiter who started the patient on 500 mg twice a day of Keppra with outpatient follow-up in neurology.    Review of Systems: Patient complains of symptoms per HPI as well as the following symptoms: no rash, no CP, no SOB, no fatigue. Pertinent negatives and positives per HPI. All others negative.   Social History   Socioeconomic History   Marital status: Single    Spouse name: Not on file   Number of children: 0   Years of education: 12   Highest education level: Not on file  Occupational History   Occupation: [employer]  Tobacco Use   Smoking status: Never   Smokeless tobacco: Never  Vaping Use   Vaping status: Never Used  Substance and Sexual Activity   Alcohol use: No  Drug use: No   Sexual activity: Not on file  Other Topics Concern   Not on file  Social  History Narrative   Lives with parents   Caffeine use: daily iced tea, mostly water   Right handed   Social Drivers of Health   Financial Resource Strain: Not on file  Food Insecurity: No Food Insecurity (03/23/2023)   Hunger Vital Sign    Worried About Running Out of Food in the Last Year: Never true    Ran Out of Food in the Last Year: Never true  Transportation Needs: No Transportation Needs (03/23/2023)   PRAPARE - Administrator, Civil Service (Medical): No    Lack of Transportation (Non-Medical): No  Physical Activity: Not on file  Stress: Not on file  Social Connections: Not on file  Intimate Partner Violence: Not At Risk (03/23/2023)   Humiliation, Afraid, Rape, and Kick questionnaire    Fear of Current or Ex-Partner: No    Emotionally Abused: No    Physically Abused: No    Sexually Abused: No    Family History  Problem Relation Age of Onset   Diabetes Mother    Multiple sclerosis Mother    Breast cancer Maternal Grandmother    Heart disease Maternal Grandfather    Breast cancer Paternal Grandmother    Seizures Neg Hx    Colon cancer Neg Hx    Stomach cancer Neg Hx    Esophageal cancer Neg Hx    Pancreatic cancer Neg Hx    Liver disease Neg Hx     Past Medical History:  Diagnosis Date   Avoidant or restrictive food intake disorder    Hx of foot surgery 2008   reconstruction- right    Seizures (HCC)    85 weeks old    Past Surgical History:  Procedure Laterality Date   FOOT SURGERY Right 2008   reconstruction   TYMPANOSTOMY TUBE PLACEMENT      Current Outpatient Medications  Medication Sig Dispense Refill   Blood Glucose Monitoring Suppl DEVI 1 each by Does not apply route in the morning, at noon, and at bedtime. May substitute to any manufacturer covered by patient's insurance. 1 each 0   feeding supplement (ENSURE ENLIVE / ENSURE PLUS) LIQD Take 237 mLs by mouth 3 (three) times daily between meals. 237 mL 12   Glucose Blood (BLOOD GLUCOSE  TEST STRIPS) STRP 1 each by In Vitro route in the morning, at noon, and at bedtime. May substitute to any manufacturer covered by patient's insurance. 100 strip 0   Iron Carbonyl-Vitamin C-FOS (CHEWABLE IRON PO) Take 1 tablet by mouth daily.     LamoTRIgine 200 MG TB24 24 hour tablet Take 1 tablet (200 mg total) by mouth 2 (two) times daily. 180 tablet 3   multivitamin (PROSIGHT) TABS tablet Take 1 tablet by mouth daily. 30 tablet 0   ondansetron (ZOFRAN-ODT) 4 MG disintegrating tablet Take 4 mg by mouth 3 (three) times daily as needed.     TRI-SPRINTEC 0.18/0.215/0.25 MG-35 MCG tablet Take 1 tablet by mouth daily.     No current facility-administered medications for this visit.    Allergies as of 10/08/2023   (No Known Allergies)    Vitals: There were no vitals taken for this visit. Last Weight:  Wt Readings from Last 1 Encounters:  06/24/23 89 lb 2 oz (40.4 kg)   Last Height:   Ht Readings from Last 1 Encounters:  06/24/23 5\' 1"  (1.549 m)  Physical exam: Exam: Gen: NAD, conversant      CV: No palpitations or chest pain or SOB. VS: Breathing at a normal rate. Weight appears thin but improved (with reported weight gain). Not febrile. Eyes: Conjunctivae clear without exudates or hemorrhage  Neuro: Detailed Neurologic Exam  Speech:    Speech is normal; fluent and spontaneous with normal comprehension.  Cognition:    The patient is oriented to person, place, and time;     recent and remote memory intact;     language fluent;     normal attention, concentration, fund of knowledge Cranial Nerves:    The pupils are equal, round, and reactive to light. Visual fields are full Extraocular movements are intact.  The face is symmetric with normal sensation. The palate elevates in the midline. Hearing intact. Voice is normal. Shoulder shrug is normal. The tongue has normal motion without fasciculations.   Coordination: normal  Gait:    No abnormalities noted or  reported  Motor Observation:   no involuntary movements noted. Tone:    Appears normal  Posture:    Posture is normal. normal erect    Strength:    Strength is anti-gravity and symmetric in the upper and lower limbs.      Sensation: intact to LT, no reports of numbness or tingling or paresthesias       Assessment/Plan:  This is an absolutely lovely 28 year old female here for follow-up of seizures with a past medical history of developmental delay and seizures secondary to in utero strokes. MRI of the brain showed chronic hemorrhagic infarctions in the parietal lobes bilaterally left greater than right and a smaller area of chronic infarction in the occipital lobes bilaterally. Three-day EEG showed right-sided sharp epileptiform discharges consistent with her auras which are left-sided numbness.  -  It has been a months since she has had a seizure aura, and we discussed whether it was frequent or if it was distressing and family and patient report it does not happen often and it is very brief and at this point we can maintain her current regimen.  I did discuss with her to keep a journal of when it happens, how long it lasts, what are the symptoms and we can further discuss it and also timing whether it is during her period as Lamictal can fluctuate with estrogen levels and explained this to patient and family. She does not drive.   - Endoscopy with biopsies that diagnosed her with celiac's disease.  She had gone from 105 pound to 75 pounds in the last several few months prior to admission but today reports she is almost back to 100 pounds and feeling well.   She was admitted 8 18-8 24.  I reviewed Dr. Dr. Lorain Childes summary note from 03/29/2023:  Significantly reduced oral intake and suffered significant weight loss resulting in a BMI of 14.  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  She was hypotensive in the hospital.  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  - Continue lamictal 200mg  bid  Meds ordered this encounter  Medications   LamoTRIgine 200 MG TB24 24 hour tablet  Sig: Take 1 tablet (200 mg total) by mouth 2 (two) times daily.    Dispense:  180 tablet    Refill:  3   No orders of the defined types were placed in this encounter.     Naomie Dean, MD  [facility] [address] [place], [place] [address]  Phone [phone] Fax [phone]

## 2023-10-12 NOTE — Addendum Note (Signed)
 Addended by: Naomie Dean B on: 10/12/2023 01:10 PM   Modules accepted: Level of Service

## 2023-10-28 ENCOUNTER — Encounter: Payer: Self-pay | Admitting: Internal Medicine

## 2023-10-28 ENCOUNTER — Other Ambulatory Visit (INDEPENDENT_AMBULATORY_CARE_PROVIDER_SITE_OTHER): Payer: MEDICAID

## 2023-10-28 ENCOUNTER — Ambulatory Visit (INDEPENDENT_AMBULATORY_CARE_PROVIDER_SITE_OTHER): Payer: MEDICAID | Admitting: Internal Medicine

## 2023-10-28 VITALS — BP 90/62 | HR 88 | Ht 62.0 in | Wt 95.4 lb

## 2023-10-28 DIAGNOSIS — K900 Celiac disease: Secondary | ICD-10-CM | POA: Diagnosis not present

## 2023-10-28 DIAGNOSIS — R634 Abnormal weight loss: Secondary | ICD-10-CM | POA: Diagnosis not present

## 2023-10-28 DIAGNOSIS — R1013 Epigastric pain: Secondary | ICD-10-CM

## 2023-10-28 LAB — CBC WITH DIFFERENTIAL/PLATELET
Basophils Absolute: 0 10*3/uL (ref 0.0–0.1)
Basophils Relative: 0.7 % (ref 0.0–3.0)
Eosinophils Absolute: 0.3 10*3/uL (ref 0.0–0.7)
Eosinophils Relative: 4.2 % (ref 0.0–5.0)
HCT: 39.7 % (ref 36.0–46.0)
Hemoglobin: 13.2 g/dL (ref 12.0–15.0)
Lymphocytes Relative: 19.7 % (ref 12.0–46.0)
Lymphs Abs: 1.3 10*3/uL (ref 0.7–4.0)
MCHC: 33.2 g/dL (ref 30.0–36.0)
MCV: 91.6 fl (ref 78.0–100.0)
Monocytes Absolute: 0.6 10*3/uL (ref 0.1–1.0)
Monocytes Relative: 9.2 % (ref 3.0–12.0)
Neutro Abs: 4.4 10*3/uL (ref 1.4–7.7)
Neutrophils Relative %: 66.2 % (ref 43.0–77.0)
Platelets: 334 10*3/uL (ref 150.0–400.0)
RBC: 4.33 Mil/uL (ref 3.87–5.11)
RDW: 13.5 % (ref 11.5–15.5)
WBC: 6.7 10*3/uL (ref 4.0–10.5)

## 2023-10-28 LAB — COMPREHENSIVE METABOLIC PANEL
ALT: 14 U/L (ref 0–35)
AST: 15 U/L (ref 0–37)
Albumin: 4.2 g/dL (ref 3.5–5.2)
Alkaline Phosphatase: 77 U/L (ref 39–117)
BUN: 15 mg/dL (ref 6–23)
CO2: 30 meq/L (ref 19–32)
Calcium: 9.6 mg/dL (ref 8.4–10.5)
Chloride: 101 meq/L (ref 96–112)
Creatinine, Ser: 0.74 mg/dL (ref 0.40–1.20)
GFR: 110.38 mL/min (ref >60.00)
Glucose, Bld: 61 mg/dL — ABNORMAL LOW (ref 70–99)
Potassium: 4.3 meq/L (ref 3.5–5.1)
Sodium: 139 meq/L (ref 135–145)
Total Bilirubin: 0.4 mg/dL (ref 0.2–1.2)
Total Protein: 7.7 g/dL (ref 6.0–8.3)

## 2023-10-28 LAB — FERRITIN: Ferritin: 10.2 ng/mL (ref 10.0–291.0)

## 2023-10-28 MED ORDER — HYOSCYAMINE SULFATE 0.125 MG SL SUBL: 0.1250 mg | SUBLINGUAL_TABLET | SUBLINGUAL | 11 refills | Status: AC | PRN

## 2023-10-28 NOTE — Progress Notes (Signed)
 HISTORY OF PRESENT ILLNESS:  Martha Wells is a 28 y.o. 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   After a hiatus from this office, she was reevaluated February 21, 2023.  She was set up for upper endoscopy.  She was noted to have microcytic anemia consistent with iron deficiency.  Upper endoscopy was performed February 25, 2023.  Examination was grossly normal.  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  Subsequent tissue transglutaminase antibody IgA was markedly elevated.  She underwent other testing including CT angiogram to rule out.  Mesenteric artery syndrome.  Her superior mesenteric artery distance was measured at 4.6 mm.   She was seen by hematology regarding her anemia.  Seen by nutrition regarding celiac disease.  She has been on a gluten-free diet.  She was last seen in this office June 24, 2023.  Today patient.  She was feeling much better at that time.  Had gained 10 pounds.  Less abdominal complaints.  Repeat blood work that day revealed resolution of anemia with hemoglobin 12.7.  Ferritin level was low at 10.3.  Tissue transglutaminase antibody was still elevated at 113 (though much better than her previous value of greater than 250).  She was told to continue multivitamin as well as iron supplement and follow-up in 6 months.   She presents today for requested follow-up.  She is companied by her father.  Overall she has been doing well.  No losing her bowels about once every other day.  Uses MiraLAX on demand.  She does note daily issues with mid abdominal cramping discomfort.  Often worsened after bowel movement.  They report compliance with gluten-free diet.  She continues to gain weight (6 pounds since her last visit).    REVIEW OF SYSTEMS:  All non-GI ROS negative. Past Medical History:   Diagnosis Date   Avoidant or restrictive food intake disorder    Hx of foot surgery 2008   reconstruction- right    Seizures (HCC)    73 weeks old    Past Surgical History:  Procedure Laterality Date   FOOT SURGERY Right 2008   reconstruction   TYMPANOSTOMY TUBE PLACEMENT      Social History Martha Wells  reports that she has never smoked. She has never used smokeless tobacco. She reports that she does not drink alcohol and does not use drugs.  family history includes Breast cancer in her maternal grandmother and paternal grandmother; Diabetes in her mother; Heart disease in her maternal grandfather; Multiple sclerosis in her mother.  No Known Allergies     PHYSICAL EXAMINATION: Vital signs: BP 90/62   Pulse 88   Ht 5\' 2"  (1.575 m)   Wt 95 lb 6.4 oz (43.3 kg)   BMI 17.45 kg/m   Constitutional: Thin but generally well-appearing, no acute distress Psychiatric: alert and oriented x3, cooperative Eyes: extraocular movements intact, anicteric, conjunctiva pink Mouth: oral pharynx moist, no lesions Neck: supple no lymphadenopathy Cardiovascular: heart regular rate and rhythm, no murmur Lungs: clear to auscultation bilaterally Abdomen: soft, nontender, nondistended, no obvious ascites, no peritoneal signs, normal bowel sounds, no organomegaly Extremities: no lower extremity edema bilaterally.  Purplish hue right lower extremity Skin: no relevant lesions on visible extremities Neuro: No focal deficits.   ASSESSMENT:   1.  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  GI symptoms improving significantly  since initiation of free diet. 2.  Iron deficiency anemia secondary to celiac disease.  Improved. 3.  Chronic abdominal complaints secondary to celiac disease.  Improved.  Still with complaints of some abdominal cramping discomfort 4.  Weight loss secondary to celiac disease.  Improved.  BMI now 17.45     PLAN:   1.  CBC, comprehensive metabolic panel and  ferritin today 2.  Tissue transglutaminase antibody IgA today. 3.  Continue strict gluten avoidance. 4.  Continue close follow-up with nutrition 5.  Continue multivitamin with iron 6.  Try to adjust MiraLAX to achieve bowel movements once daily 7.  Prescribe Levsin sublingual 0.125 mg.  Take 1 or 2 sublingual every 4 hours as needed for abdominal cramping pain 8.  Routine GI follow-up 6 months. Total time of 30 minutes was spent preparing to see the patient, obtaining interval history, performing medically appropriate physical examination, counseling and educating the patient and her father regarding the above issues, ordering blood work, arranging follow-up, and documenting clinical information in the health record.

## 2023-10-28 NOTE — Patient Instructions (Signed)
 Your provider has requested that you go to the basement level for lab work before leaving today. Press "B" on the elevator. The lab is located at the first door on the left as you exit the elevator.  We have sent the following medications to your pharmacy for you to pick up at your convenience: Levsin  Please follow up in 6 months

## 2023-10-30 LAB — TISSUE TRANSGLUTAMINASE, IGA: (tTG) Ab, IgA: 54.5 U/mL — ABNORMAL HIGH

## 2023-10-31 ENCOUNTER — Encounter: Payer: Self-pay | Admitting: Internal Medicine

## 2024-01-25 IMAGING — DX DG HIP (WITH OR WITHOUT PELVIS) 2-3V*R*
3 series · 3 of 3 positions shown · non-contrast
Comparison: None Available.

CLINICAL DATA: Fall, pain.

EXAM:
DG HIP (WITH OR WITHOUT PELVIS) 2-3V RIGHT

[t pelvis ap]
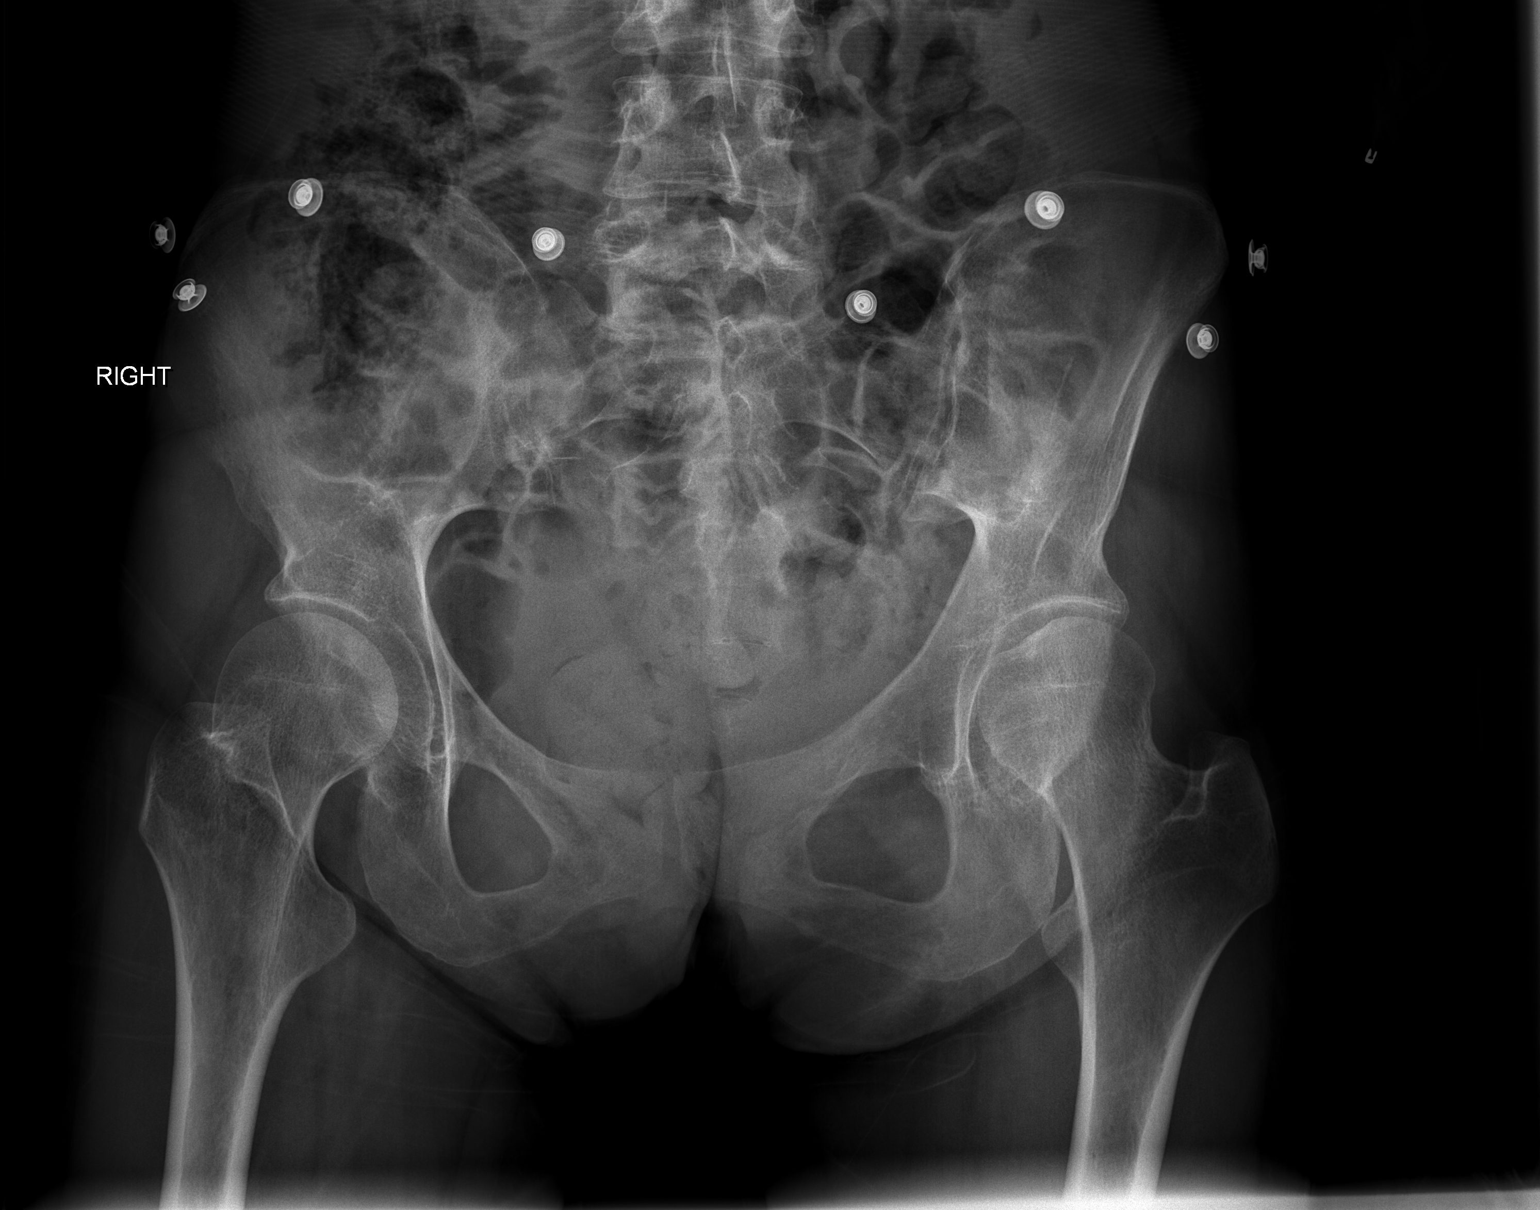

[t hip ap right]
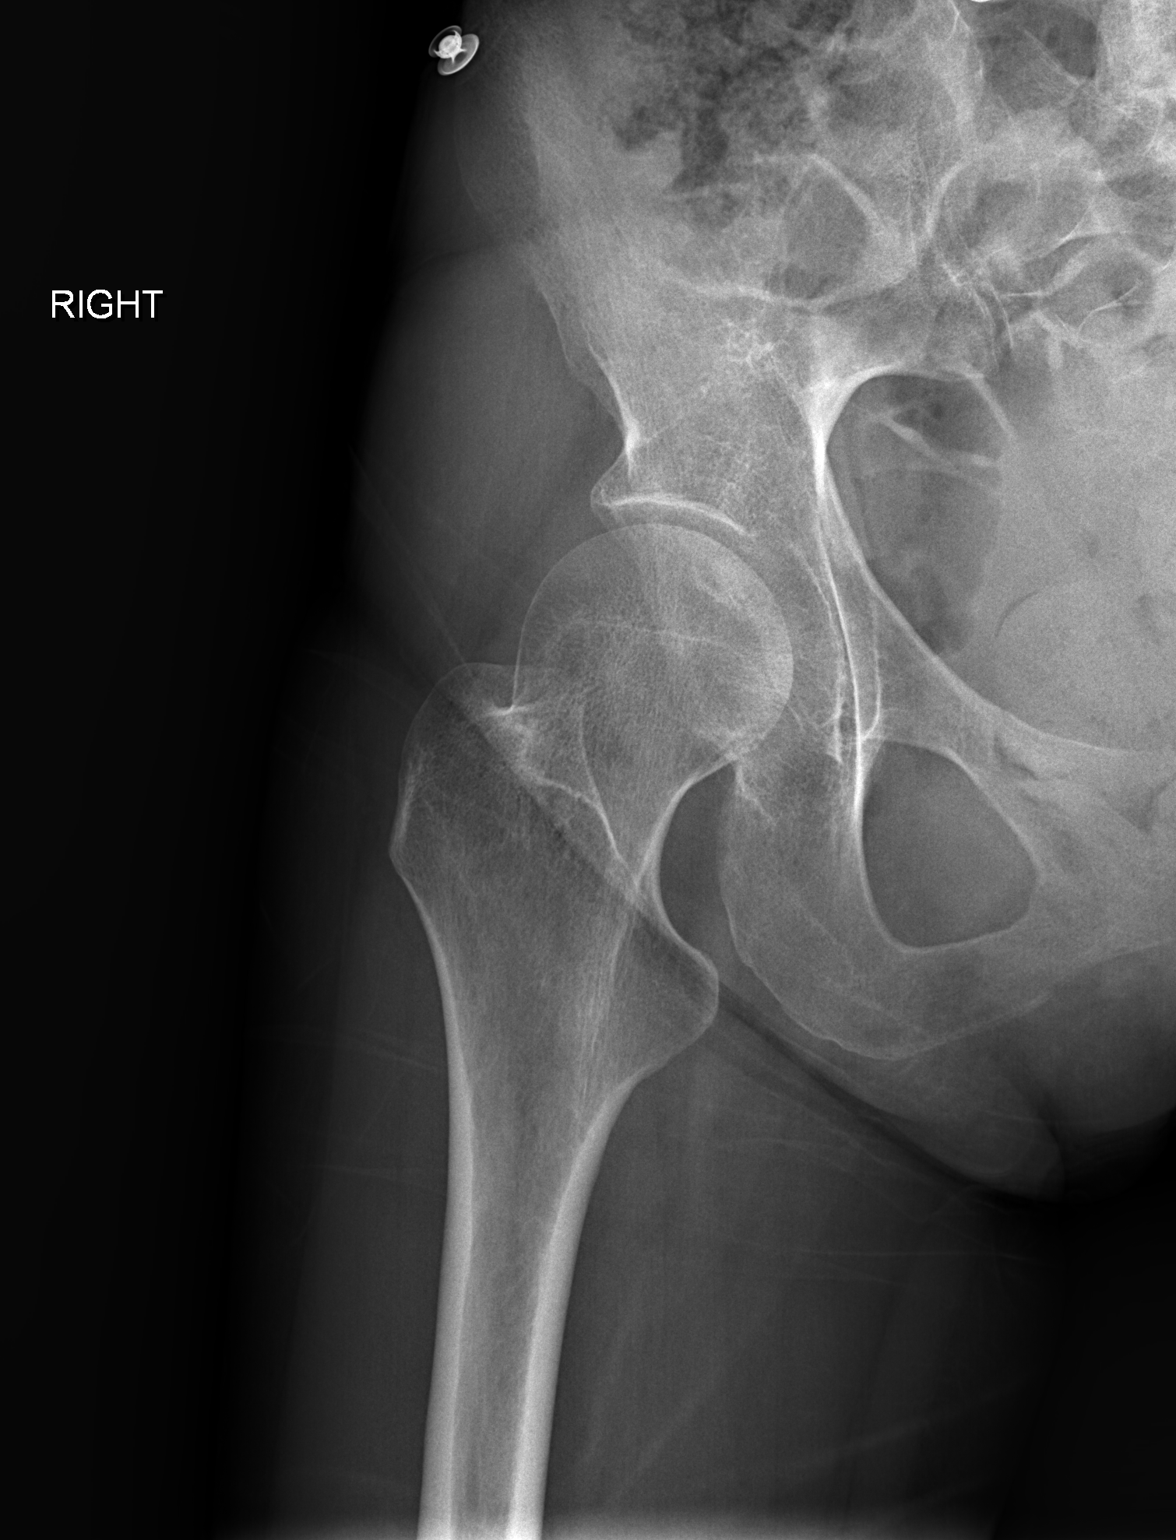

[t hip frog leg right]
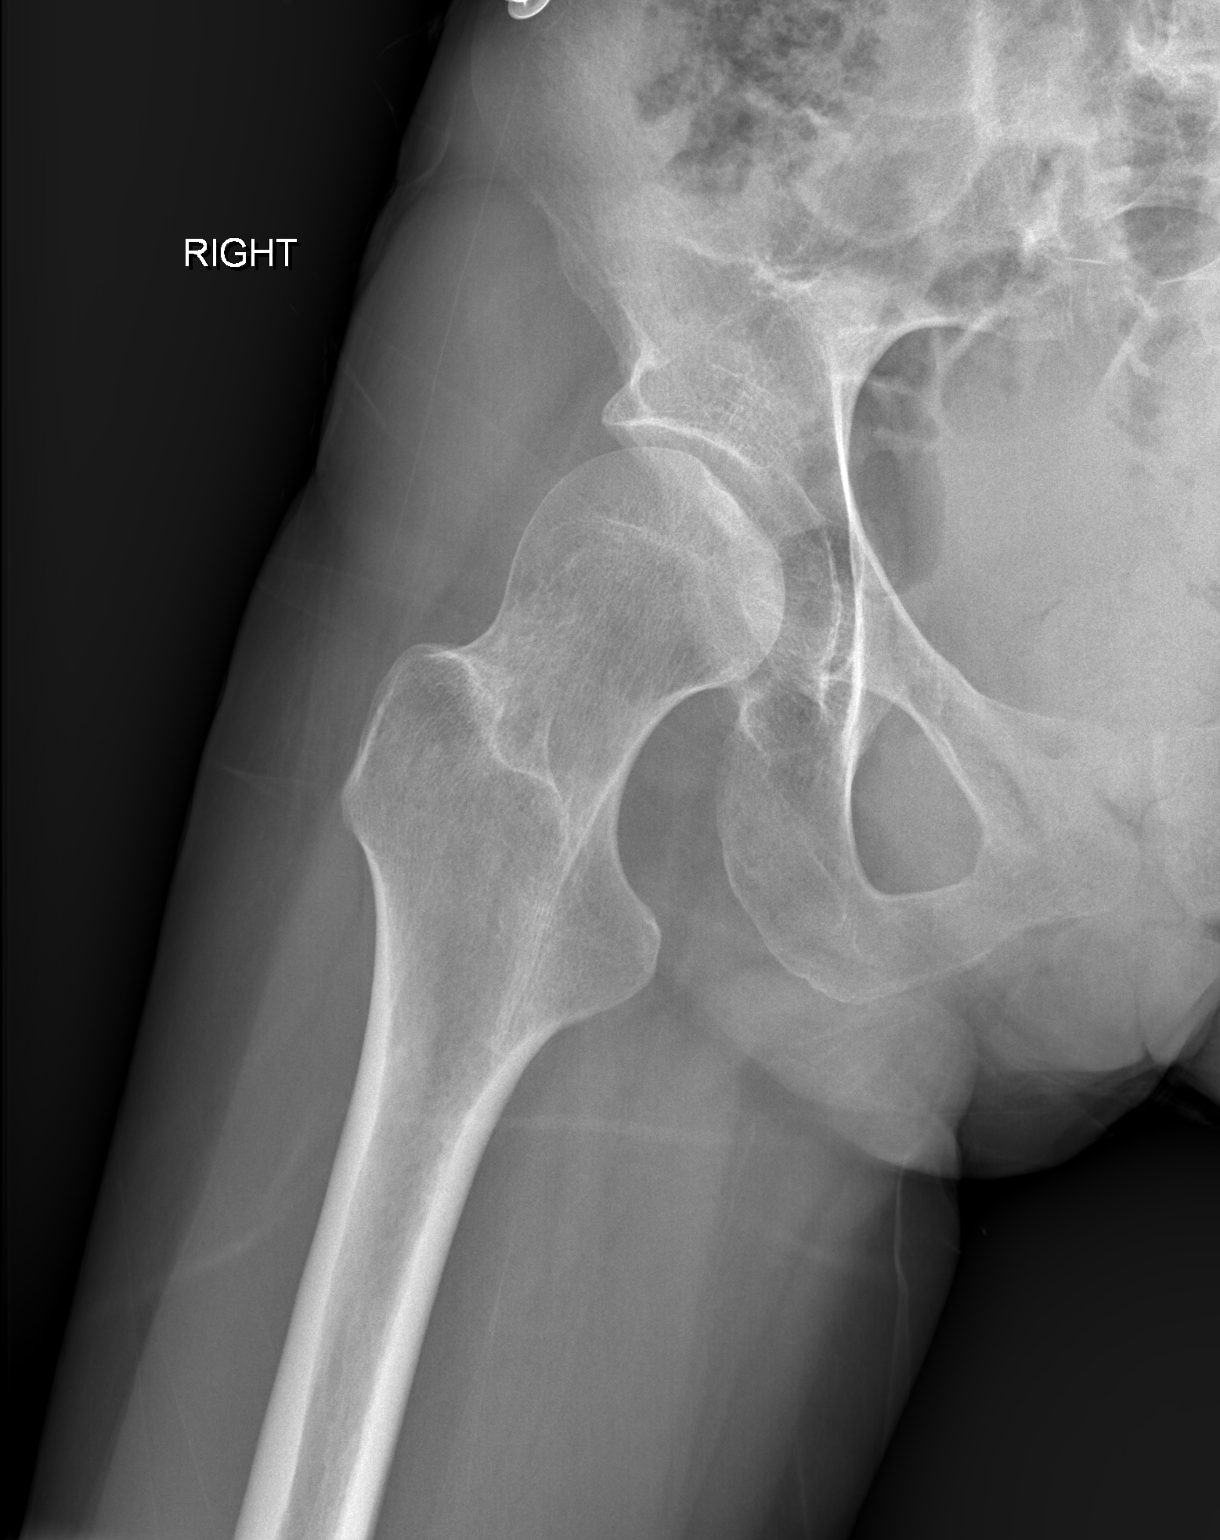

[3 of 3 positions shown; findings below may reference images not displayed]

FINDINGS: There is no evidence of hip fracture or dislocation. There is no
evidence of arthropathy or other focal bone abnormality.
IMPRESSION: Negative.

## 2024-03-08 ENCOUNTER — Telehealth: Payer: Self-pay | Admitting: Neurology

## 2024-03-08 NOTE — Telephone Encounter (Signed)
 Reviewed chart. Pt's most recent DPR for system only has father on there. I called him and discussed message from mother. He verbalized understanding of reason for calling him. He and pt's mother are divorced and patient lives with him but mother should also be on DPR and he states they have a good relationship. He understands patient can be brought to office during office hours to fill out a new DPR. Regarding her auras, he states that for longer than 4 days patient has had avg of 1 aura per day. It usually happens at strange times, generally when she is eating or finishing eating. She will report numbness on L face or body which he states is consistent with prior seizure auras. He says patient never has a full blown seizure. She is always awake and conversant with no other symptoms while she is reporting aura. He states one time she said she thought she had one overnight but may have been dreaming it. He confirms patient does not miss any medication. She takes Lamotrigine  200 mg BID. She has GI issues (celiac) and sees Dr Abran at [place]. Does not have vomiting. She has gained weight and is now 106 lb but last year had gotten as low as 75 lb (was hospitalized 1 yr ago with GI issues). He is aware Dr Ines out of office but I will check with work-in MD and call him back with recommendations. He appreciated the call.

## 2024-03-08 NOTE — Telephone Encounter (Signed)
 Pt mother called to request to speak to Nurse the patient  has been having  a lot of pre seizure symptoms ,. Pt has been feeling numbness in her face and neck . Pt numbness is bothering the way she speaks sometimes . This has been going on for the last 4 days . Mother  and Pt is not sure if its due to pt medication but  Pt is very concern and mother as well. Mother believes Pt need to be seen .  Mother is requesting  a call  just to see what she can do for PT .

## 2024-03-09 MED ORDER — CLOBAZAM 10 MG PO TABS
5.0000 mg | ORAL_TABLET | Freq: Every day | ORAL | 2 refills | Status: DC
Start: 2024-03-09 — End: 2024-05-31

## 2024-03-09 NOTE — Addendum Note (Signed)
 Addended byBETHA GREGG LEK on: 03/09/2024 03:51 PM   Modules accepted: Orders

## 2024-03-09 NOTE — Addendum Note (Signed)
 Addended by: HILLIARD HEATHER CROME on: 03/09/2024 02:47 PM   Modules accepted: Orders

## 2024-03-09 NOTE — Telephone Encounter (Signed)
 Will recommend adding Clobazam  at night, 5 mg and continue to monitor the symptoms.

## 2024-03-09 NOTE — Telephone Encounter (Signed)
 Patient's mother said would like a call to patient's father today. Will be going to [place]  and do not want to end up in the ER. Will let him know to answer phone.

## 2024-03-09 NOTE — Telephone Encounter (Signed)
 I spoke with the patient's father regarding Clobazam . He understands this is recommended by Dr Gregg and he would like to move forward, however he does want to make sure Dr Ines is ok with this since is patient's primary neurologist. Pt's father will also discuss with the pt's mother. He does not feel he will let patient travel right now while starting a new medication. Patient did have another episode today. He is aware Dr Ines is out of the office until tomorrow but we will speak with her as soon as we can and will let him know. I also offered, per Lauraine NP, to schedule a VV with patient and parents to discuss the medication in more detail if needed. He is aware this medication is in the benzodiazepine class and should not be combined with other benzodiazepines. Clobazam  may cause drowsiness so it is taken at night, low dose 5 mg. There is a chance this could cause constipation or drooling as well. He thanked me for the call.

## 2024-03-09 NOTE — Telephone Encounter (Signed)
 I spoke with Dr Ines. She does agree with Dr Janean recommendation to start Onfi  5 mg at bedtime. I spoke with pt's father again and let him know. He was very appreciative and will proceed with filling the medication. His questions were answered and he thanked me for the call. Will be in touch with us  if patient does not have decrease in auras or if she has any issues with Onfi .

## 2024-03-12 NOTE — Telephone Encounter (Signed)
Noted, will discuss with patient at follow up.

## 2024-03-22 ENCOUNTER — Telehealth: Payer: Self-pay | Admitting: Neurology

## 2024-03-22 NOTE — Telephone Encounter (Signed)
 Pt's father called stating that he would like to speak to the provider or RN to discuss a couple of things regarding the medication change that was made for the pt. Please advise.

## 2024-03-23 NOTE — Telephone Encounter (Signed)
 I called and spoke to father of pt. Since starting the onfi  5mg  po at bedtime.  Martha Wells's stopped.  Still with the L facial / arm numbness (lasting several seconds to minute) when she in shower (long hot shower) or eating.  Has f/u in 10/2024 VV with Dr. Ines.

## 2024-03-24 ENCOUNTER — Encounter: Payer: Self-pay | Admitting: Neurology

## 2024-03-26 ENCOUNTER — Telehealth: Payer: Self-pay | Admitting: Neurology

## 2024-03-26 NOTE — Telephone Encounter (Signed)
 She is doing well on the Onfi  is helping her sleep and improves the aura. She has fatigue during the day. Could also decrease to 2.5mg  at bedtime but would have to use the suspesion because tabs lowest dose is 10.   She has left fingertip pain and her left hand numb when sleeping. The the right hand tingling and pain. She moves it and rubs it and makes it go away. Her aura would be left hand and left face but since the right hand is tingly too I wonder if these symptoms are something different like CTS. Could try wearing wrist splints at bedtime and see if that helps.  She will let us  know. Discussed with her father and Cody today and they will let us  know.

## 2024-03-26 NOTE — Telephone Encounter (Signed)
 Mother called (not on DPR) stating that pt tired/fatigued, still with numbness arms /face. Auras per father had stopped.  She said that pt is worsening and is asking what to do.

## 2024-04-26 NOTE — Progress Notes (Unsigned)
 Martha Console, PA-C [address] [place], [place]  [address] Phone: [phone]   Primary Care Physician: Martha Prentice DEL, MD  Primary Gastroenterologist:  Martha Console, PA-C / Martha Kiang, MD   Chief Complaint:  F/U Celiac Disease   HPI:   Martha Wells is a 28 y.o. female, an established patient of Dr. Kiang, returns for 59-month follow-up of celiac sprue.   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  She has been evaluated intermittently over the years for chronic abdominal pain and weight loss.  She is here today with her father.   02/2023 EGD: Duodenal biopsies positive for celiac sprue.  Labs showed moderately elevated TTG IgA greater than 250.  Has history of iron deficiency anemia.  She takes OTC iron ferrous sulfate 325 mg sporadically.  She has been on gluten-free diet since 02/2023.  Symptoms and anemia greatly improved on gluten-free diet.  She has gained 10 pounds in the past 6 months.  Appetite has improved.      She continues to have a lot of abdominal pain located above the umbilicus and in the mid upper abdomen.  She describes it as a burning gas sensation.  It is worse at night before bed and worse after eating.  She has nausea, but no vomiting.  Has episodes of heartburn, but no dysphagia.  She is taking MiraLAX every day and is having bowel movement every other day on this treatment.  Prior to starting MiraLAX she was having bowel movement every 5 to 7 days.  Has not seen any recent rectal bleeding.  She is adhering to a gluten-free diet.  She admits to fatigue.  Is taking vitamin B12.    10/2023 last labs: Normal hemoglobin 13.2.  Normal CBC and CMP.  Normal ferritin 10.2.  TTG improved to 54.5.  In August 2024 her TTG was greater than 250.   Current Outpatient Medications  Medication Sig Dispense Refill   Blood Glucose Monitoring Suppl DEVI 1 each by Does not apply route in the morning, at noon, and at  bedtime. May substitute to any manufacturer covered by patient's insurance. 1 each 0   cloBAZam  (ONFI ) 10 MG tablet Take 0.5 tablets (5 mg total) by mouth at bedtime. 15 tablet 2   Glucose Blood (BLOOD GLUCOSE TEST STRIPS) STRP 1 each by In Vitro route in the morning, at noon, and at bedtime. May substitute to any manufacturer covered by patient's insurance. 100 strip 0   hyoscyamine  (LEVSIN  SL) 0.125 MG SL tablet Place 1-2 tablets (0.125-0.25 mg total) under the tongue every 4 (four) hours as needed. 60 tablet 11   Iron Carbonyl-Vitamin C-FOS (CHEWABLE IRON PO) Take 1 tablet by mouth daily.     lactase (LACTAID) 3000 units tablet Take 3,000 Units by mouth 3 (three) times daily with meals.     LamoTRIgine  200 MG TB24 24 hour tablet Take 1 tablet (200 mg total) by mouth 2 (two) times daily. 180 tablet 3   multivitamin (PROSIGHT) TABS tablet Take 1 tablet by mouth daily. 30 tablet 0   ondansetron  (ZOFRAN -ODT) 4 MG disintegrating tablet Take 4 mg by mouth 3 (three) times daily as needed.     TRI-SPRINTEC 0.18/0.215/0.25 MG-35 MCG tablet Take 1 tablet by mouth daily.     No current facility-administered medications for this visit.    Allergies as of 04/27/2024 - Review Complete 04/27/2024  Allergen Reaction Noted   Lactose intolerance (  gi)  04/27/2024   Wheat  04/27/2024    Past Medical History:  Diagnosis Date   Avoidant or restrictive food intake disorder    Hx of foot surgery 2008   reconstruction- right    Seizures (HCC)    66 weeks old    Past Surgical History:  Procedure Laterality Date   FOOT SURGERY Right 2008   reconstruction   TYMPANOSTOMY TUBE PLACEMENT      Review of Systems:    All systems reviewed and negative except where noted in HPI.    Physical Exam:  BP (!) 86/58 (BP Location: Left Arm, Patient Position: Sitting, Cuff Size: Normal)   Pulse 100   Ht 5' 1 (1.549 m)   Wt 105 lb 6 oz (47.8 kg)   BMI 19.91 kg/m  No LMP recorded. (Menstrual status: Oral  contraceptives).  General: Well-nourished, well-developed in no acute distress.  Lungs: Clear to auscultation bilaterally. Non-labored. Heart: Regular rate and rhythm, no murmurs rubs or gallops.  Abdomen: Bowel sounds are normal; Abdomen is Soft; No hepatosplenomegaly, masses or hernias; moderate tenderness in the epigastrium and above the umbilicus.  No lower abdominal Tenderness; No guarding or rebound tenderness. Neuro: Alert and oriented x 3.  Grossly intact.  Psych: Alert and cooperative, normal mood and affect. Skin: No rashes   Imaging Studies: No results found.  Labs: CBC    Component Value Date/Time   WBC 6.7 10/28/2023 1026   RBC 4.33 10/28/2023 1026   HGB 13.2 10/28/2023 1026   HGB 11.3 04/09/2023 1051   HCT 39.7 10/28/2023 1026   HCT 36.0 04/09/2023 1051   PLT 334.0 10/28/2023 1026   PLT 528 (H) 04/09/2023 1051   MCV 91.6 10/28/2023 1026   MCV 91 04/09/2023 1051   MCH 28.7 04/09/2023 1051   MCH 29.1 04/02/2023 1056   MCHC 33.2 10/28/2023 1026   RDW 13.5 10/28/2023 1026   RDW 15.0 04/09/2023 1051   LYMPHSABS 1.3 10/28/2023 1026   LYMPHSABS 1.4 04/09/2023 1051   MONOABS 0.6 10/28/2023 1026   EOSABS 0.3 10/28/2023 1026   EOSABS 0.1 04/09/2023 1051   BASOSABS 0.0 10/28/2023 1026   BASOSABS 0.0 04/09/2023 1051    CMP     Component Value Date/Time   NA 139 10/28/2023 1026   NA 140 04/09/2023 1051   K 4.3 10/28/2023 1026   CL 101 10/28/2023 1026   CO2 30 10/28/2023 1026   GLUCOSE 61 (L) 10/28/2023 1026   BUN 15 10/28/2023 1026   BUN 10 04/09/2023 1051   CREATININE 0.74 10/28/2023 1026   CALCIUM 9.6 10/28/2023 1026   PROT 7.7 10/28/2023 1026   PROT 7.2 04/09/2023 1051   ALBUMIN 4.2 10/28/2023 1026   ALBUMIN 4.3 04/09/2023 1051   AST 15 10/28/2023 1026   ALT 14 10/28/2023 1026   ALKPHOS 77 10/28/2023 1026   BILITOT 0.4 10/28/2023 1026   BILITOT 0.3 04/09/2023 1051   GFRNONAA >60 03/29/2023 0322   GFRAA 129 01/29/2018 0806       Assessment and  Plan:   Martha Wells is a 28 y.o. y/o female returns for 8-month follow-up of celiac disease.  Currently doing well on gluten-free diet.  1.  Celiac disease - Continue gluten-free diet - Follow-up celiac labs:  TTG, DGP(deamidated gliadin peptide), vitamins (A, D, E, B12), folic acid , ferritin, iron, thiamine, vitamin B6, magnesium, copper, zinc, and selenium. - I advised them to talk with her PCP about ordering a bone Density test to screen for  osteoporosis. - Patient education handouts were given from up-to-date regarding celiac disease and gluten-free diet.  2.  Constipation - Gave samples of Linzess  72 mcg QD for 1 week, then 145 mcg QD for 1 week.  Patient will let me know which dose works best, and then we can send a prescription.   - She can either continue or discontinue MiraLAX, based on bowel habits.  3. IDA - Labs: CBC, iron panel, ferritin - If iron is low, then we will instruct her to take iron every day.  4.  Upper abdominal Pain: Epig / Supra-Umbilical: Suspect due to chronic constipation.  Last abdominal pelvic CT 03/2023 showed no acute abnormality.  Complete abdominal ultrasound and HIDA scan have been unrevealing. - Giving trial of Linzess  for constipation to see if this will help her abdominal pain.   Martha Console, PA-C  Follow up 3 months with TG.

## 2024-04-27 ENCOUNTER — Other Ambulatory Visit (INDEPENDENT_AMBULATORY_CARE_PROVIDER_SITE_OTHER): Payer: MEDICAID

## 2024-04-27 ENCOUNTER — Encounter: Payer: Self-pay | Admitting: Physician Assistant

## 2024-04-27 ENCOUNTER — Ambulatory Visit (INDEPENDENT_AMBULATORY_CARE_PROVIDER_SITE_OTHER): Payer: MEDICAID | Admitting: Physician Assistant

## 2024-04-27 VITALS — BP 86/58 | HR 100 | Ht 61.0 in | Wt 105.4 lb

## 2024-04-27 DIAGNOSIS — D509 Iron deficiency anemia, unspecified: Secondary | ICD-10-CM

## 2024-04-27 DIAGNOSIS — K5904 Chronic idiopathic constipation: Secondary | ICD-10-CM

## 2024-04-27 DIAGNOSIS — K900 Celiac disease: Secondary | ICD-10-CM | POA: Diagnosis not present

## 2024-04-27 DIAGNOSIS — R1013 Epigastric pain: Secondary | ICD-10-CM | POA: Diagnosis not present

## 2024-04-27 LAB — COMPREHENSIVE METABOLIC PANEL WITH GFR
ALT: 13 U/L (ref 0–35)
AST: 14 U/L (ref 0–37)
Albumin: 4.1 g/dL (ref 3.5–5.2)
Alkaline Phosphatase: 70 U/L (ref 39–117)
BUN: 10 mg/dL (ref 6–23)
CO2: 28 meq/L (ref 19–32)
Calcium: 9.4 mg/dL (ref 8.4–10.5)
Chloride: 101 meq/L (ref 96–112)
Creatinine, Ser: 0.75 mg/dL (ref 0.40–1.20)
GFR: 108.23 mL/min (ref >60.00)
Glucose, Bld: 63 mg/dL — ABNORMAL LOW (ref 70–99)
Potassium: 3.8 meq/L (ref 3.5–5.1)
Sodium: 139 meq/L (ref 135–145)
Total Bilirubin: 0.3 mg/dL (ref 0.2–1.2)
Total Protein: 7.5 g/dL (ref 6.0–8.3)

## 2024-04-27 LAB — VITAMIN B12: Vitamin B-12: >1500 pg/mL — ABNORMAL HIGH (ref 211–911)

## 2024-04-27 LAB — MAGNESIUM: Magnesium: 1.8 mg/dL (ref 1.5–2.5)

## 2024-04-27 LAB — FOLATE: Folate: 12.6 ng/mL (ref >5.9)

## 2024-04-27 LAB — VITAMIN D 25 HYDROXY (VIT D DEFICIENCY, FRACTURES): VITD: 27.74 ng/mL — ABNORMAL LOW (ref 30.00–100.00)

## 2024-04-27 NOTE — Progress Notes (Signed)
 Reviewed. Thanks for seeing her.  Sweet kid.  Great dad

## 2024-04-27 NOTE — Patient Instructions (Signed)
 Your provider has requested that you go to the basement level for lab work before leaving today. Press B on the elevator. The lab is located at the first door on the left as you exit the elevator.  We have given you samples of the following medication to take: Linzess  72 mcg and 145 mcg  Please follow up sooner if symptoms increase or worsen  Due to recent changes in healthcare laws, you may see the results of your imaging and laboratory studies on MyChart before your provider has had a chance to review them.  We understand that in some cases there may be results that are confusing or concerning to you. Not all laboratory results come back in the same time frame and the provider may be waiting for multiple results in order to interpret others.  Please give us  48 hours in order for your provider to thoroughly review all the results before contacting the office for clarification of your results.   Thank you for trusting me with your gastrointestinal care!   Ellouise Console, PA-C _______________________________________________________  If your blood pressure at your visit was 140/90 or greater, please contact your primary care physician to follow up on this.  _______________________________________________________  If you are age 71 or older, your body mass index should be between 23-30. Your Body mass index is 19.91 kg/m. If this is out of the aforementioned range listed, please consider follow up with your Primary Care Provider.  If you are age 72 or younger, your body mass index should be between 19-25. Your Body mass index is 19.91 kg/m. If this is out of the aformentioned range listed, please consider follow up with your Primary Care Provider.   ________________________________________________________  The [facility] providers would like to encourage you to use MYCHART to communicate with providers for non-urgent requests or questions.  Due to long hold times on the telephone, sending your  provider a message by [facility] may be a faster and more efficient way to get a response.  Please allow 48 business hours for a response.  Please remember that this is for non-urgent requests.  _______________________________________________________

## 2024-04-29 LAB — CELIAC AB TTG DGP TIGA
Antigliadin Abs, IgA: 6 U (ref 0–19)
Gliadin IgG: 9 U (ref 0–19)
IgA/Immunoglobulin A, Serum: 129 mg/dL (ref 87–352)
Tissue Transglut Ab: 20 U/mL — AB (ref 0–5)
Transglutaminase IgA: 5 U/mL — AB (ref 0–3)

## 2024-04-30 DIAGNOSIS — K5904 Chronic idiopathic constipation: Secondary | ICD-10-CM

## 2024-04-30 MED ORDER — LINACLOTIDE 72 MCG PO CAPS: 72.0000 ug | ORAL_CAPSULE | Freq: Every day | ORAL | 3 refills | Status: DC

## 2024-05-01 LAB — IRON,TIBC AND FERRITIN PANEL
%SAT: 34 % (ref 16–45)
Ferritin: 21 ng/mL (ref 16–154)
Iron: 116 ug/dL (ref 40–190)
TIBC: 340 ug/dL (ref 250–450)

## 2024-05-01 LAB — VITAMIN B1: Vitamin B1 (Thiamine): 17 nmol/L (ref 8–30)

## 2024-05-01 LAB — VITAMIN E
Gamma-Tocopherol (Vit E): <1 mg/L (ref <4.4)
Vitamin E (Alpha Tocopherol): 12.7 mg/L (ref 5.7–19.9)

## 2024-05-01 LAB — VITAMIN A: Vitamin A (Retinoic Acid): 80 ug/dL (ref 38–98)

## 2024-05-01 LAB — VITAMIN B6: Vitamin B6: 18.3 ng/mL (ref 2.1–21.7)

## 2024-05-01 LAB — COPPER, SERUM: Copper: 201 ug/dL — ABNORMAL HIGH (ref 70–175)

## 2024-05-01 LAB — ZINC: Zinc: 56 ug/dL — ABNORMAL LOW (ref 60–130)

## 2024-05-04 ENCOUNTER — Ambulatory Visit: Payer: Self-pay | Admitting: Physician Assistant

## 2024-05-04 ENCOUNTER — Other Ambulatory Visit (INDEPENDENT_AMBULATORY_CARE_PROVIDER_SITE_OTHER): Payer: MEDICAID

## 2024-05-04 DIAGNOSIS — R790 Abnormal level of blood mineral: Secondary | ICD-10-CM

## 2024-05-04 LAB — CBC WITH DIFFERENTIAL/PLATELET
Basophils Absolute: 0 K/uL (ref 0.0–0.1)
Basophils Relative: 0.6 % (ref 0.0–3.0)
Eosinophils Absolute: 0.3 K/uL (ref 0.0–0.7)
Eosinophils Relative: 5.3 % — ABNORMAL HIGH (ref 0.0–5.0)
HCT: 36.8 % (ref 36.0–46.0)
Hemoglobin: 12.3 g/dL (ref 12.0–15.0)
Lymphocytes Relative: 31.4 % (ref 12.0–46.0)
Lymphs Abs: 1.8 K/uL (ref 0.7–4.0)
MCHC: 33.5 g/dL (ref 30.0–36.0)
MCV: 90.6 fl (ref 78.0–100.0)
Monocytes Absolute: 0.4 K/uL (ref 0.1–1.0)
Monocytes Relative: 6.9 % (ref 3.0–12.0)
Neutro Abs: 3.3 K/uL (ref 1.4–7.7)
Neutrophils Relative %: 55.8 % (ref 43.0–77.0)
Platelets: 350 K/uL (ref 150.0–400.0)
RBC: 4.06 Mil/uL (ref 3.87–5.11)
RDW: 13.3 % (ref 11.5–15.5)
WBC: 5.9 K/uL (ref 4.0–10.5)

## 2024-05-05 LAB — CERULOPLASMIN: Ceruloplasmin: 50 mg/dL — ABNORMAL HIGH (ref 14–48)

## 2024-05-06 ENCOUNTER — Other Ambulatory Visit: Payer: MEDICAID

## 2024-05-06 DIAGNOSIS — R790 Abnormal level of blood mineral: Secondary | ICD-10-CM

## 2024-05-09 ENCOUNTER — Ambulatory Visit: Payer: Self-pay | Admitting: Physician Assistant

## 2024-05-09 LAB — COPPER, URINE, 24 HOUR
Copper,Urine (24 Hr): 15 ug/(24.h) (ref 15–60)
Total Volume: 1500 mL

## 2024-05-31 ENCOUNTER — Other Ambulatory Visit: Payer: Self-pay | Admitting: Neurology

## 2024-05-31 NOTE — Telephone Encounter (Signed)
 Requested Prescriptions   Pending Prescriptions Disp Refills   cloBAZam  (ONFI ) 10 MG tablet [Pharmacy Med Name: cloBAZam  10 MG Oral Tablet] 15 tablet 0    Sig: TAKE 1/2 (ONE-HALF) TABLET BY MOUTH AT BEDTIME   Last seen 10/08/23 Next appt scheduled3/3/26 Dispenses   Dispensed Days Supply Quantity Provider Pharmacy  CLOBAZAM  10MG        TAB 05/08/2024 30 15 each Gregg Lek, MD [facility] [id number] ...  CLOBAZAM  10MG        TAB 04/11/2024 30 15 each Gregg Lek, MD [facility] [phone] ...  CLOBAZAM  10MG        TAB 03/09/2024 30 15 each Gregg Lek, MD [facility] [phone] .SABRASABRA

## 2024-06-22 NOTE — Telephone Encounter (Signed)
 05/11/24 note from Ellouise Console, PA-C to patient indicates that Ellouise would like to have patient follow up in the office and order repeat labs at that time rather than getting those now.

## 2024-06-29 NOTE — Progress Notes (Signed)
 "     Ellouise Console, PA-C [address] [place], [place]  [address] Phone: [phone]   Primary Care Physician: Tanda Prentice DEL, MD  Primary Gastroenterologist:  Ellouise Console, PA-C / [name], MD   Chief Complaint: Follow-up celiac disease, chronic abdominal pain, chronic constipation, IDA.       HPI:   Discussed the use of AI scribe software for clinical note transcription with the patient, who gave verbal consent to proceed.  50-month follow-up of celiac disease, chronic constipation, iron deficiency anemia, and abdominal pain.  2 months ago she was started on Linzess  for constipation.  She continues to have stomach pain mostly at night.  She tried Linzess  which caused increased abdominal pain and diarrhea and she discontinued.  She is here today with her father.  04/2024 labs: Mildly elevated ceruloplasmin.  Elevated serum copper  201.  24-hour urine copper  test was normal.  No evidence of Wilson's disease.  She was advised to avoid foods containing copper .  TTG greatly improved down to 20.  Elevated vitamin B12 greater than 1500.  Normal folate.  Normal magnesium.  Normal vitamin A .  Low vitamin D  (27).  Low zinc  (56).  Normal iron.  Normal vitamin E.  Normal vitamin B6 and B1.  To start on vitamin D  and zinc  supplements.  Over the Count Zinc , take 30mg  once daily.  Over the Counter Vitamin D3, take 600 international units daily.  She is avoiding foods that contain copper  such as almonds and cashews.  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      02/2023 EGD: Duodenal biopsies positive for celiac sprue.  Labs showed moderately elevated TTG IgA greater than 250.    10/2023 labs: Normal hemoglobin 13.2.  Normal CBC and CMP.  Normal ferritin 10.2.  TTG improved to 54.5.  In August 2024 her TTG was greater than 250.  History of Present Illness Polly Mischele Detter is a 28 year old female with celiac disease who presents with abdominal  pain and dietary management issues. She is accompanied by her father, who provides additional information about her condition.  She was diagnosed with celiac disease in March 2024. She adheres to a gluten-free diet and has seen a nutritionist in the past to help manage her diet, especially due to her lactose intolerance. Despite these efforts, she continues to experience abdominal pain, particularly at night, before bed.  Her father notes that her abdominal pain is less frequent and less severe since she has been on gluten-free diet.  She has a history of lactose intolerance, which complicates her dietary management. She avoids milk, dairy, gluten, spices, garlic, and onions, which exacerbate her symptoms. Her weight has stabilized around 105-106 pounds, which is an improvement from previous fluctuations. She rarely eats out due to dietary restrictions.  She has a history of constipation and cannot tolerate Linzess  due to severe diarrhea. She uses Miralax and has increased her vegetable intake to manage her symptoms.   Her B12 levels were previously high due to supplementation, but she has decreased her B12 supplement.    Current Outpatient Medications  Medication Sig Dispense Refill   Blood Glucose Monitoring Suppl DEVI 1 each by Does not apply route in the morning, at noon, and at bedtime. May substitute to any manufacturer covered by patient's insurance. 1 each 0   cloBAZam  (ONFI ) 10 MG tablet TAKE 1/2 (ONE-HALF) TABLET BY MOUTH AT BEDTIME 15 tablet 3   Glucose Blood (BLOOD  GLUCOSE TEST STRIPS) STRP 1 each by In Vitro route in the morning, at noon, and at bedtime. May substitute to any manufacturer covered by patient's insurance. 100 strip 0   Iron Carbonyl-Vitamin C-FOS (CHEWABLE IRON PO) Take 1 tablet by mouth daily.     lactase (LACTAID) 3000 units tablet Take 3,000 Units by mouth 3 (three) times daily with meals.     LamoTRIgine  200 MG TB24 24 hour tablet Take 1 tablet (200 mg total) by  mouth 2 (two) times daily. 180 tablet 3   multivitamin (PROSIGHT) TABS tablet Take 1 tablet by mouth daily. 30 tablet 0   ondansetron  (ZOFRAN -ODT) 4 MG disintegrating tablet Take 4 mg by mouth 3 (three) times daily as needed.     TRI-SPRINTEC 0.18/0.215/0.25 MG-35 MCG tablet Take 1 tablet by mouth daily.     hyoscyamine  (LEVSIN  SL) 0.125 MG SL tablet Place 1-2 tablets (0.125-0.25 mg total) under the tongue every 4 (four) hours as needed. (Patient not taking: Reported on 06/30/2024) 60 tablet 11   No current facility-administered medications for this visit.    Allergies as of 06/30/2024 - Review Complete 06/30/2024  Allergen Reaction Noted   Lactose intolerance (gi)  04/27/2024   Wheat  04/27/2024    Past Medical History:  Diagnosis Date   Avoidant or restrictive food intake disorder    Hx of foot surgery 2008   reconstruction- right    Seizures (HCC)    78 weeks old    Past Surgical History:  Procedure Laterality Date   FOOT SURGERY Right 2008   reconstruction   TYMPANOSTOMY TUBE PLACEMENT      Review of Systems:    All systems reviewed and negative except where noted in HPI.    Physical Exam:  BP (!) 100/58   Pulse 66   Ht 5' 2 (1.575 m)   Wt 105 lb 8 oz (47.9 kg)   LMP 05/26/2024 (Approximate)   BMI 19.30 kg/m  Patient's last menstrual period was 05/26/2024 (approximate).  General: Well-nourished, well-developed in no acute distress.  Neuro: Alert and oriented x 3.  Grossly intact.  Psych: Alert and cooperative, normal mood and affect.   Imaging Studies: No results found.  Labs: CBC    Component Value Date/Time   WBC 5.9 05/04/2024 1442   RBC 4.06 05/04/2024 1442   HGB 12.3 05/04/2024 1442   HGB 11.3 04/09/2023 1051   HCT 36.8 05/04/2024 1442   HCT 36.0 04/09/2023 1051   PLT 350.0 05/04/2024 1442   PLT 528 (H) 04/09/2023 1051   MCV 90.6 05/04/2024 1442   MCV 91 04/09/2023 1051   MCH 28.7 04/09/2023 1051   MCH 29.1 04/02/2023 1056   MCHC 33.5  05/04/2024 1442   RDW 13.3 05/04/2024 1442   RDW 15.0 04/09/2023 1051   LYMPHSABS 1.8 05/04/2024 1442   LYMPHSABS 1.4 04/09/2023 1051   MONOABS 0.4 05/04/2024 1442   EOSABS 0.3 05/04/2024 1442   EOSABS 0.1 04/09/2023 1051   BASOSABS 0.0 05/04/2024 1442   BASOSABS 0.0 04/09/2023 1051    CMP     Component Value Date/Time   NA 139 04/27/2024 1006   NA 140 04/09/2023 1051   K 3.8 04/27/2024 1006   CL 101 04/27/2024 1006   CO2 28 04/27/2024 1006   GLUCOSE 63 (L) 04/27/2024 1006   BUN 10 04/27/2024 1006   BUN 10 04/09/2023 1051   CREATININE 0.75 04/27/2024 1006   CALCIUM 9.4 04/27/2024 1006   PROT 7.5 04/27/2024 1006  PROT 7.2 04/09/2023 1051   ALBUMIN 4.1 04/27/2024 1006   ALBUMIN 4.3 04/09/2023 1051   AST 14 04/27/2024 1006   ALT 13 04/27/2024 1006   ALKPHOS 70 04/27/2024 1006   BILITOT 0.3 04/27/2024 1006   BILITOT 0.3 04/09/2023 1051   GFRNONAA >60 03/29/2023 0322   GFRAA 129 01/29/2018 0806     Assessment and Plan:   Nalea Salce He is a 28 y.o. y/o female returns for follow-up of: Assessment & Plan 1.  Celiac disease with dietary management and monitoring of micronutrient deficiencies Celiac disease diagnosed in March 2024 with significant improvement in TTG levels from 250 to 20.  Weight is stable and improved. Vitamin D , and zinc  were low.  B12 and copper  were elevated, but not harmful.   - Continue gluten-free diet. - Rechecked copper , zinc , B12, and vitamin D  levels. - Rechecked TTG levels. - Continue dietary management with focus on low copper  foods. - Continue vitamin D  and zinc  supplements.  Adjust supplement based on lab results. - Consider referral to nutritionist if needed.  2.  Constipation Chronic constipation managed with Miralax. Linzess  discontinued due to severe diarrhea and abdominal pain. - Continue Miralax for constipation management.  3.  Chronic abdominal pain improved Possibly related to dietary triggers, celiac disease, or  constipation. Pain has decreased in frequency but still occurs.  - Continue to monitor dietary triggers and avoid known irritants. - Continue MiraLAX for constipation.  4.  Lactose intolerance Managed with dietary modifications. Lactase supplements are used as needed. - Continue lactase supplements as needed. - Continue avoiding milk and dairy products.   Ellouise Console, PA-C  Follow up in 6 months.  Also follow-up based on lab results.   "

## 2024-06-30 ENCOUNTER — Encounter: Payer: Self-pay | Admitting: Physician Assistant

## 2024-06-30 ENCOUNTER — Other Ambulatory Visit: Payer: MEDICAID

## 2024-06-30 ENCOUNTER — Ambulatory Visit: Payer: MEDICAID | Admitting: Physician Assistant

## 2024-06-30 VITALS — BP 100/58 | HR 66 | Ht 62.0 in | Wt 105.5 lb

## 2024-06-30 DIAGNOSIS — R790 Abnormal level of blood mineral: Secondary | ICD-10-CM

## 2024-06-30 DIAGNOSIS — E60 Dietary zinc deficiency: Secondary | ICD-10-CM

## 2024-06-30 DIAGNOSIS — E569 Vitamin deficiency, unspecified: Secondary | ICD-10-CM | POA: Diagnosis not present

## 2024-06-30 DIAGNOSIS — E559 Vitamin D deficiency, unspecified: Secondary | ICD-10-CM | POA: Diagnosis not present

## 2024-06-30 DIAGNOSIS — K900 Celiac disease: Secondary | ICD-10-CM

## 2024-06-30 LAB — VITAMIN B12: Vitamin B-12: 587 pg/mL (ref 211–911)

## 2024-06-30 LAB — VITAMIN D 25 HYDROXY (VIT D DEFICIENCY, FRACTURES): VITD: 32.36 ng/mL (ref 30.00–100.00)

## 2024-06-30 NOTE — Progress Notes (Signed)
 Noted

## 2024-06-30 NOTE — Patient Instructions (Signed)
 Your provider has requested that you go to the basement level for lab work before leaving today. Press B on the elevator. The lab is located at the first door on the left as you exit the elevator.  Please follow up sooner if symptoms increase or worsen  Due to recent changes in healthcare laws, you may see the results of your imaging and laboratory studies on MyChart before your provider has had a chance to review them.  We understand that in some cases there may be results that are confusing or concerning to you. Not all laboratory results come back in the same time frame and the provider may be waiting for multiple results in order to interpret others.  Please give us  48 hours in order for your provider to thoroughly review all the results before contacting the office for clarification of your results.   Thank you for trusting me with your gastrointestinal care!   Ellouise Console, PA-C _______________________________________________________  If your blood pressure at your visit was 140/90 or greater, please contact your primary care physician to follow up on this.  _______________________________________________________  If you are age 28 or older, your body mass index should be between 23-30. Your Body mass index is 19.3 kg/m. If this is out of the aforementioned range listed, please consider follow up with your Primary Care Provider.  If you are age 110 or younger, your body mass index should be between 19-25. Your Body mass index is 19.3 kg/m. If this is out of the aformentioned range listed, please consider follow up with your Primary Care Provider.   ________________________________________________________  The [facility] providers would like to encourage you to use MYCHART to communicate with providers for non-urgent requests or questions.  Due to long hold times on the telephone, sending your provider a message by [facility] may be a faster and more efficient way to get a response.  Please  allow 48 business hours for a response.  Please remember that this is for non-urgent requests.  _______________________________________________________

## 2024-07-05 ENCOUNTER — Ambulatory Visit: Payer: Self-pay | Admitting: Physician Assistant

## 2024-07-08 LAB — TISSUE TRANSGLUTAMINASE ABS,IGG,IGA
(tTG) Ab, IgA: 22.9 U/mL — ABNORMAL HIGH
(tTG) Ab, IgG: 15.5 U/mL — ABNORMAL HIGH

## 2024-07-08 LAB — COPPER, SERUM: Copper: 196 ug/dL — ABNORMAL HIGH (ref 70–175)

## 2024-07-08 LAB — ZINC: Zinc: 57 ug/dL — ABNORMAL LOW (ref 60–130)

## 2024-08-22 NOTE — Progress Notes (Unsigned)
 "     Martha Console, PA-C [address] [place], [place]  [address] Phone: [phone]   Primary Care Physician: Tanda Prentice DEL, MD  Primary Gastroenterologist:  Martha Console, PA-C / [name], MD   Chief Complaint: Follow-up celiac disease, constipation and abdominal pain      HPI:   Discussed the use of AI scribe software for clinical note transcription with the patient, who gave verbal consent to proceed.  I last saw patient 06/30/2024.  She has history of celiac, chronic abdominal pain, chronic constipation, and iron deficiency anemia.  Also has lactose intolerance.  Here today with her father.  She tried and failed Linzess  which caused increased abdominal pain and diarrhea.  Constipation has been treated with MiraLAX with benefit.  She has been on vitamin D  and zinc  for vitamin deficiencies.  Weight has improved on gluten-free diet.  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       02/2023 EGD: Duodenal biopsies positive for celiac sprue.  Labs showed moderately elevated TTG IgA greater than 250.    10/2023 labs: Normal hemoglobin 13.2.  Normal CBC and CMP.  Normal ferritin 10.2.  TTG improved to 54.5.  In August 2024 her TTG was greater than 250.  History of Present Illness Martha Wells is a 29 year old female with celiac disease, iron deficiency anemia, and chronic constipation who presents for evaluation of persistent abdominal pain.  Abdominal Pain: - Localized to the periumbilical region - Occurs daily and has become nearly constant - Worsens at night before bed - Exacerbated by eating, especially foods cooked in oil - Severe episodes can be incapacitating, as after a restaurant meal 1.5 weeks ago - No associated weight loss; weight remains stable - Increased abdominal gas and frequent abdominal noises  Constipation and Gastrointestinal Bleeding: - Bowel movements typically every other day, occasionally on  consecutive days - Previous intervals between bowel movements were 6-7 days, improved with Miralax (taken every other day) - Pain occurs during or after bowel movements, sometimes increasing after defecation before subsiding - Occasional passage of rocky stool - Intermittent blood in the stool - Hyoscyamine  was ineffective and discontinued - Linzess  caused significant diarrhea and abdominal pain, leading to discontinuation - No use of Trulance, Amitiza, or Ibsrela  Celiac Disease: - Managed with a gluten-free diet - Celiac serologies have improved on recent laboratory testing - Abdominal pain and gastrointestinal symptoms persist despite dietary management  Iron Deficiency Anemia: - Recent laboratory improvement in iron levels  Dietary Modifications: - Avoids spicy and fried foods - Maintains a bland diet consisting of fish, chicken, broccoli, and spinach - Strict avoidance of gluten  Imaging Findings: - CT scan in August 2024 demonstrated a large amount of stool throughout the colon  03/2023 CTA abdomen pelvis with and without contrast: 1. No evidence for mesenteric ischemia. 2. Decreased superior mesenteric artery distance measuring 4.6 mm. 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 3.  No acute localizing process in the abdomen or pelvis. 4.. Large amount of stool throughout the colon.  03/2023 complete abdominal ultrasound: No cholelithiasis or sonographic evidence for acute cholecystitis.   02/2023 HIDA scan: Biliary activity passes into small bowel, consistent with patent common bile duct.   Current Outpatient Medications  Medication Sig Dispense Refill   Blood Glucose Monitoring Suppl DEVI 1 each by Does not apply route in the morning, at noon, and at bedtime. May substitute  to any manufacturer covered by patient's insurance. 1 each 0   cloBAZam  (ONFI ) 10 MG tablet TAKE 1/2 (ONE-HALF) TABLET BY MOUTH AT BEDTIME 15 tablet 3   Glucose Blood  (BLOOD GLUCOSE TEST STRIPS) STRP 1 each by In Vitro route in the morning, at noon, and at bedtime. May substitute to any manufacturer covered by patient's insurance. 100 strip 0   Iron Carbonyl-Vitamin C-FOS (CHEWABLE IRON PO) Take 1 tablet by mouth daily.     lactase (LACTAID) 3000 units tablet Take 3,000 Units by mouth 3 (three) times daily with meals.     LamoTRIgine  200 MG TB24 24 hour tablet Take 1 tablet (200 mg total) by mouth 2 (two) times daily. 180 tablet 3   multivitamin (PROSIGHT) TABS tablet Take 1 tablet by mouth daily. 30 tablet 0   Na Sulfate-K Sulfate-Mg Sulfate concentrate (SUPREP) 17.5-3.13-1.6 GM/177ML SOLN Take 1 kit (354 mLs total) by mouth once for 1 dose. 354 mL 0   ondansetron  (ZOFRAN -ODT) 4 MG disintegrating tablet Take 4 mg by mouth 3 (three) times daily as needed.     TRI-SPRINTEC 0.18/0.215/0.25 MG-35 MCG tablet Take 1 tablet by mouth daily.     hyoscyamine  (LEVSIN  SL) 0.125 MG SL tablet Place 1-2 tablets (0.125-0.25 mg total) under the tongue every 4 (four) hours as needed. (Patient not taking: Reported on 08/23/2024) 60 tablet 11   No current facility-administered medications for this visit.    Allergies as of 08/23/2024 - Review Complete 08/23/2024  Allergen Reaction Noted   Lactose intolerance (gi)  04/27/2024   Wheat  04/27/2024    Past Medical History:  Diagnosis Date   Avoidant or restrictive food intake disorder    Hx of foot surgery 2008   reconstruction- right    Seizures (HCC)    56 weeks old    Past Surgical History:  Procedure Laterality Date   FOOT SURGERY Right 2008   reconstruction   TYMPANOSTOMY TUBE PLACEMENT      Review of Systems:    All systems reviewed and negative except where noted in HPI.    Physical Exam:  BP 102/70   Pulse 78   Ht 5' 2 (1.575 m)   Wt 107 lb (48.5 kg)   BMI 19.57 kg/m  No LMP recorded. (Menstrual status: Oral contraceptives).  General: Well-nourished, well-developed in no acute distress.  Lungs:  Clear to auscultation bilaterally. Non-labored. Heart: Regular rate and rhythm, no murmurs rubs or gallops.  Abdomen: Bowel sounds are normal; Abdomen is Soft; No hepatosplenomegaly, masses or hernias;   mild to moderate periumbilical tenderness, worse above the umbilicus.  No guarding or rebound tenderness. Neuro: Alert and oriented x 3.  Grossly intact.  Psych: Alert and cooperative, normal mood and affect.   Imaging Studies: No results found.  Labs: CBC    Component Value Date/Time   WBC 5.9 05/04/2024 1442   RBC 4.06 05/04/2024 1442   HGB 12.3 05/04/2024 1442   HGB 11.3 04/09/2023 1051   HCT 36.8 05/04/2024 1442   HCT 36.0 04/09/2023 1051   PLT 350.0 05/04/2024 1442   PLT 528 (H) 04/09/2023 1051   MCV 90.6 05/04/2024 1442   MCV 91 04/09/2023 1051   MCH 28.7 04/09/2023 1051   MCH 29.1 04/02/2023 1056   MCHC 33.5 05/04/2024 1442   RDW 13.3 05/04/2024 1442   RDW 15.0 04/09/2023 1051   LYMPHSABS 1.8 05/04/2024 1442   LYMPHSABS 1.4 04/09/2023 1051   MONOABS 0.4 05/04/2024 1442   EOSABS 0.3 05/04/2024 1442  EOSABS 0.1 04/09/2023 1051   BASOSABS 0.0 05/04/2024 1442   BASOSABS 0.0 04/09/2023 1051    CMP     Component Value Date/Time   NA 139 04/27/2024 1006   NA 140 04/09/2023 1051   K 3.8 04/27/2024 1006   CL 101 04/27/2024 1006   CO2 28 04/27/2024 1006   GLUCOSE 63 (L) 04/27/2024 1006   BUN 10 04/27/2024 1006   BUN 10 04/09/2023 1051   CREATININE 0.75 04/27/2024 1006   CALCIUM 9.4 04/27/2024 1006   PROT 7.5 04/27/2024 1006   PROT 7.2 04/09/2023 1051   ALBUMIN 4.1 04/27/2024 1006   ALBUMIN 4.3 04/09/2023 1051   AST 14 04/27/2024 1006   ALT 13 04/27/2024 1006   ALKPHOS 70 04/27/2024 1006   BILITOT 0.3 04/27/2024 1006   BILITOT 0.3 04/09/2023 1051   GFRNONAA >60 03/29/2023 0322   GFRAA 129 01/29/2018 0806       Assessment and Plan:   Maylyn Narvaiz Labine is a 29 y.o. y/o female returns for follow-up of:  1.  Chronic worsening periumbilical abdominal  pain  2.  Chronic constipation: Improved on MiraLAX  3.  Celiac disease: Improved on gluten-free diet  4.  Rectal Bleeding  5.  Iron Deficiency Anemia: Improved on iron and gluten-free diet Assessment & Plan 1.  Chronic abdominal pain with constipation and rectal bleeding Persistent periumbilical pain with constipation and hematochezia. Differential includes inflammatory bowel disease, functional constipation, and colonic pathology. Concern for intestinal inflammation or bleeding despite improved celiac serologies. - Ordered abdominal x-ray to evaluate stool burden - Ordered fecal calprotectin for intestinal inflammation. - Scheduled colonoscopy for inflammatory bowel disease, colonic bleeding, and other pathology. - I discussed risks of colonoscopy with patient to include risk of bleeding, colon perforation, and risk of sedation.  Patient expressed understanding and agrees to proceed with colonoscopy.  - Documented prior intolerance to Linzess  and hyoscyamine . - Discussed alternative medications (Trulance, Amitiza, Ibsrela) for constipation pending diagnostic results. - Provided guidance on colonoscopy preparation and rationale for further workup. - Planned follow-up post-diagnostics.  3.  Celiac disease Serologic markers improved with gluten-free diet - Reinforced adherence to gluten-free diet.  3.  Iron deficiency anemia Laboratory values improved, but ongoing Rectal bleeding requires further evaluation to rule out additional lower GI sources. - Scheduling colonoscopy to rule out lower GI source   Martha Console, PA-C  Follow up 4 weeks after colonoscopy with TG.   "

## 2024-08-23 ENCOUNTER — Ambulatory Visit: Payer: MEDICAID | Admitting: Physician Assistant

## 2024-08-23 ENCOUNTER — Encounter: Payer: Self-pay | Admitting: Physician Assistant

## 2024-08-23 ENCOUNTER — Ambulatory Visit
Admission: RE | Admit: 2024-08-23 | Discharge: 2024-08-23 | Disposition: A | Payer: MEDICAID | Source: Ambulatory Visit | Attending: Physician Assistant | Admitting: Physician Assistant

## 2024-08-23 ENCOUNTER — Other Ambulatory Visit: Payer: MEDICAID

## 2024-08-23 VITALS — BP 102/70 | HR 78 | Ht 62.0 in | Wt 107.0 lb

## 2024-08-23 DIAGNOSIS — K900 Celiac disease: Secondary | ICD-10-CM

## 2024-08-23 DIAGNOSIS — R1033 Periumbilical pain: Secondary | ICD-10-CM | POA: Diagnosis not present

## 2024-08-23 DIAGNOSIS — R1084 Generalized abdominal pain: Secondary | ICD-10-CM

## 2024-08-23 DIAGNOSIS — D509 Iron deficiency anemia, unspecified: Secondary | ICD-10-CM | POA: Diagnosis not present

## 2024-08-23 DIAGNOSIS — K625 Hemorrhage of anus and rectum: Secondary | ICD-10-CM

## 2024-08-23 DIAGNOSIS — K5904 Chronic idiopathic constipation: Secondary | ICD-10-CM

## 2024-08-23 DIAGNOSIS — K5909 Other constipation: Secondary | ICD-10-CM | POA: Diagnosis not present

## 2024-08-23 MED ORDER — NA SULFATE-K SULFATE-MG SULF 17.5-3.13-1.6 GM/177ML PO SOLN: 1.0000 | Freq: Once | ORAL | 0 refills | Status: AC

## 2024-08-23 NOTE — Patient Instructions (Signed)
 Your provider has requested that you go to the basement level for lab work before leaving today. Press B on the elevator. The lab is located at the first door on the left as you exit the elevator.  Due to recent changes in healthcare laws, you may see the results of your imaging and laboratory studies on MyChart before your provider has had a chance to review them.  We understand that in some cases there may be results that are confusing or concerning to you. Not all laboratory results come back in the same time frame and the provider may be waiting for multiple results in order to interpret others.  Please give us  48 hours in order for your provider to thoroughly review all the results before contacting the office for clarification of your results.    Your provider has requested that you have an abdominal x ray before leaving today. Please go to the basement floor to our Radiology department for the test.   You have been scheduled for a colonoscopy. Please follow written instructions given to you at your visit today.   If you use inhalers (even only as needed), please bring them with you on the day of your procedure.  DO NOT TAKE 7 DAYS PRIOR TO TEST- Trulicity (dulaglutide) Ozempic, Wegovy (semaglutide) Mounjaro, Zepbound (tirzepatide) Bydureon Bcise (exanatide extended release)  DO NOT TAKE 1 DAY PRIOR TO YOUR TEST Rybelsus (semaglutide) Adlyxin (lixisenatide) Victoza (liraglutide) Byetta (exanatide) ___________________________________________________________________________   Follow up in 4 weeks after colonoscopy.  Thank you for trusting me with your gastrointestinal care!   Ellouise Console, PA-C   _______________________________________________________  If your blood pressure at your visit was 140/90 or greater, please contact your primary care physician to follow up on this.  _______________________________________________________  If you are age 29 or older, your body  mass index should be between 23-30. Your Body mass index is 19.57 kg/m. If this is out of the aforementioned range listed, please consider follow up with your Primary Care Provider.  If you are age 49 or younger, your body mass index should be between 19-25. Your Body mass index is 19.57 kg/m. If this is out of the aformentioned range listed, please consider follow up with your Primary Care Provider.   ________________________________________________________  The [facility] providers would like to encourage you to use MYCHART to communicate with providers for non-urgent requests or questions.  Due to long hold times on the telephone, sending your provider a message by [facility] may be a faster and more efficient way to get a response.  Please allow 48 business hours for a response.  Please remember that this is for non-urgent requests.  _______________________________________________________  [facility] is using a team-based approach to care.  Your team is made up of your doctor and two to three APPS. Our APPS (Nurse Practitioners and Physician Assistants) work with your physician to ensure care continuity for you. They are fully qualified to address your health concerns and develop a treatment plan. They communicate directly with your gastroenterologist to care for you. Seeing the Advanced Practice Practitioners on your physician's team can help you by facilitating care more promptly, often allowing for earlier appointments, access to diagnostic testing, procedures, and other specialty referrals.

## 2024-08-23 NOTE — Progress Notes (Signed)
 Noted.

## 2024-08-27 ENCOUNTER — Ambulatory Visit: Payer: Self-pay | Admitting: Physician Assistant

## 2024-08-27 DIAGNOSIS — K5904 Chronic idiopathic constipation: Secondary | ICD-10-CM

## 2024-08-27 MED ORDER — TRULANCE 3 MG PO TABS: 3.0000 mg | ORAL_TABLET | Freq: Every day | ORAL | 2 refills | Status: DC

## 2024-08-30 ENCOUNTER — Encounter: Payer: MEDICAID | Admitting: Internal Medicine

## 2024-09-09 ENCOUNTER — Telehealth: Payer: Self-pay

## 2024-09-09 ENCOUNTER — Other Ambulatory Visit (HOSPITAL_COMMUNITY): Payer: Self-pay

## 2024-09-09 MED ORDER — IBSRELA 50 MG PO TABS: 50.0000 mg | ORAL_TABLET | Freq: Two times a day (BID) | ORAL | Status: AC

## 2024-09-09 NOTE — Telephone Encounter (Signed)
 Pharmacy Patient Advocate Encounter   Received notification from Pt Calls Messages that prior authorization for Ibsrela  50MG  tablets is required/requested.   Insurance verification completed.   The patient is insured through [facility] MEDICAID.   Per test claim: PA required; PA submitted to above mentioned insurance via Latent Key/confirmation #/EOC [id number] Status is pending

## 2024-09-09 NOTE — Telephone Encounter (Signed)
 PA request has been Submitted. New Encounter has been or will be created for follow up. For additional info see Pharmacy Prior Auth telephone encounter from 09/09/2024.

## 2024-09-10 NOTE — Telephone Encounter (Signed)
 Pharmacy Patient Advocate Encounter  Received notification from [facility] MEDICAID that Prior Authorization for Ibsrela  50MG  tablets  has been DENIED.  Full denial letter will be uploaded to the media tab. See denial reason below.  Here are the policy requirements your request did not meet:   The request does not meet the [place]  Medicaid Preferred Drug List (PDL) trial and failure criteria for the use of a non-preferred drug. To meet the criteria for approval, you must first try the following preferred drug on the [place] Preferred Drug List (PDL) (this is a list of covered drugs):  Lubiprostone capsule (generic for Amitiza)  Our pharmacy records and information sent in by your doctor do not show that this drug was tried and whether or not it helped your condition. If you have already tried this drug and it cause side effects, did not work, or there is a reason that you cannot take this drug, please ask your doctor to send us  this information.   PA #/Case ID/Reference #: [id number]

## 2024-09-16 ENCOUNTER — Encounter: Payer: MEDICAID | Admitting: Internal Medicine

## 2024-10-05 ENCOUNTER — Ambulatory Visit: Payer: MEDICAID | Admitting: Neurology

## 2024-10-12 ENCOUNTER — Telehealth: Payer: MEDICAID | Admitting: Neurology
